# Patient Record
Sex: Female | Born: 1985 | Race: Black or African American | Hispanic: No | Marital: Single | Smoking: Current every day smoker
Health system: Southern US, Community
[De-identification: ages and names within clinical notes are randomized; demographics above are authoritative.]

## PROBLEM LIST (undated history)

## (undated) DIAGNOSIS — E669 Obesity, unspecified: Secondary | ICD-10-CM

## (undated) DIAGNOSIS — IMO0002 Reserved for concepts with insufficient information to code with codable children: Secondary | ICD-10-CM

## (undated) DIAGNOSIS — R87619 Unspecified abnormal cytological findings in specimens from cervix uteri: Secondary | ICD-10-CM

## (undated) DIAGNOSIS — I10 Essential (primary) hypertension: Secondary | ICD-10-CM

## (undated) HISTORY — DX: Reserved for concepts with insufficient information to code with codable children: IMO0002

## (undated) HISTORY — DX: Unspecified abnormal cytological findings in specimens from cervix uteri: R87.619

## (undated) HISTORY — DX: Essential (primary) hypertension: I10

---
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
## 2006-10-09 ENCOUNTER — Emergency Department (HOSPITAL_COMMUNITY): Admission: EM | Admit: 2006-10-09 | Discharge: 2006-10-09 | Payer: Self-pay | Admitting: Family Medicine

## 2007-04-07 ENCOUNTER — Ambulatory Visit: Payer: Self-pay | Admitting: Internal Medicine

## 2007-04-07 ENCOUNTER — Encounter (INDEPENDENT_AMBULATORY_CARE_PROVIDER_SITE_OTHER): Payer: Self-pay | Admitting: Nurse Practitioner

## 2007-04-07 LAB — CONVERTED CEMR LAB
ALT: 10 units/L (ref 0–35)
AST: 13 units/L (ref 0–37)
Alkaline Phosphatase: 62 units/L (ref 39–117)
BUN: 12 mg/dL (ref 6–23)
Basophils Absolute: 0 10*3/uL (ref 0.0–0.1)
Basophils Relative: 1 % (ref 0–1)
CO2: 24 meq/L (ref 19–32)
Calcium: 8.8 mg/dL (ref 8.4–10.5)
Chlamydia, Swab/Urine, PCR: POSITIVE — AB
Chloride: 105 meq/L (ref 96–112)
Creatinine, Ser: 0.61 mg/dL (ref 0.40–1.20)
Eosinophils Relative: 1 % (ref 0–5)
GC Probe Amp, Urine: NEGATIVE
Glucose, Bld: 69 mg/dL — ABNORMAL LOW (ref 70–99)
HCT: 34.3 % — ABNORMAL LOW (ref 36.0–46.0)
Hemoglobin: 10.6 g/dL — ABNORMAL LOW (ref 12.0–15.0)
Lymphocytes Relative: 30 % (ref 12–46)
Lymphs Abs: 1.9 10*3/uL (ref 0.7–3.3)
MCV: 68.6 fL — ABNORMAL LOW (ref 78.0–100.0)
Monocytes Absolute: 0.4 10*3/uL (ref 0.2–0.7)
Monocytes Relative: 7 % (ref 3–11)
Neutro Abs: 4 10*3/uL (ref 1.7–7.7)
Neutrophils Relative %: 62 % (ref 43–77)
Potassium: 3.8 meq/L (ref 3.5–5.3)
RBC: 5 M/uL (ref 3.87–5.11)
RDW: 16.8 % — ABNORMAL HIGH (ref 11.5–14.0)
TSH: 1.488 microintl units/mL (ref 0.350–5.50)
Total Bilirubin: 0.5 mg/dL (ref 0.3–1.2)

## 2007-07-02 ENCOUNTER — Emergency Department (HOSPITAL_COMMUNITY): Admission: EM | Admit: 2007-07-02 | Discharge: 2007-07-02 | Payer: Self-pay | Admitting: Emergency Medicine

## 2008-07-30 ENCOUNTER — Emergency Department (HOSPITAL_COMMUNITY): Admission: EM | Admit: 2008-07-30 | Discharge: 2008-07-30 | Payer: Self-pay | Admitting: Emergency Medicine

## 2008-10-16 ENCOUNTER — Emergency Department (HOSPITAL_COMMUNITY): Admission: EM | Admit: 2008-10-16 | Discharge: 2008-10-16 | Payer: Self-pay | Admitting: Emergency Medicine

## 2009-01-27 ENCOUNTER — Emergency Department (HOSPITAL_COMMUNITY): Admission: EM | Admit: 2009-01-27 | Discharge: 2009-01-27 | Payer: Self-pay | Admitting: *Deleted

## 2009-06-20 ENCOUNTER — Emergency Department (HOSPITAL_COMMUNITY): Admission: EM | Admit: 2009-06-20 | Discharge: 2009-06-20 | Payer: Self-pay | Admitting: Family Medicine

## 2009-11-13 ENCOUNTER — Emergency Department (HOSPITAL_COMMUNITY): Admission: EM | Admit: 2009-11-13 | Discharge: 2009-11-13 | Payer: Self-pay | Admitting: Family Medicine

## 2010-01-06 ENCOUNTER — Emergency Department (HOSPITAL_COMMUNITY): Admission: EM | Admit: 2010-01-06 | Discharge: 2010-01-06 | Payer: Self-pay | Admitting: Family Medicine

## 2010-04-15 ENCOUNTER — Emergency Department (HOSPITAL_COMMUNITY): Admission: EM | Admit: 2010-04-15 | Discharge: 2010-04-15 | Payer: Self-pay | Admitting: Emergency Medicine

## 2010-05-04 IMAGING — CR DG CHEST 2V
2 series · 2 of 2 positions shown · non-contrast
Comparison: 10/16/2008

CLINICAL DATA: Mid and upper left chest pain.

CHEST - 2 VIEW

[w chest pa]
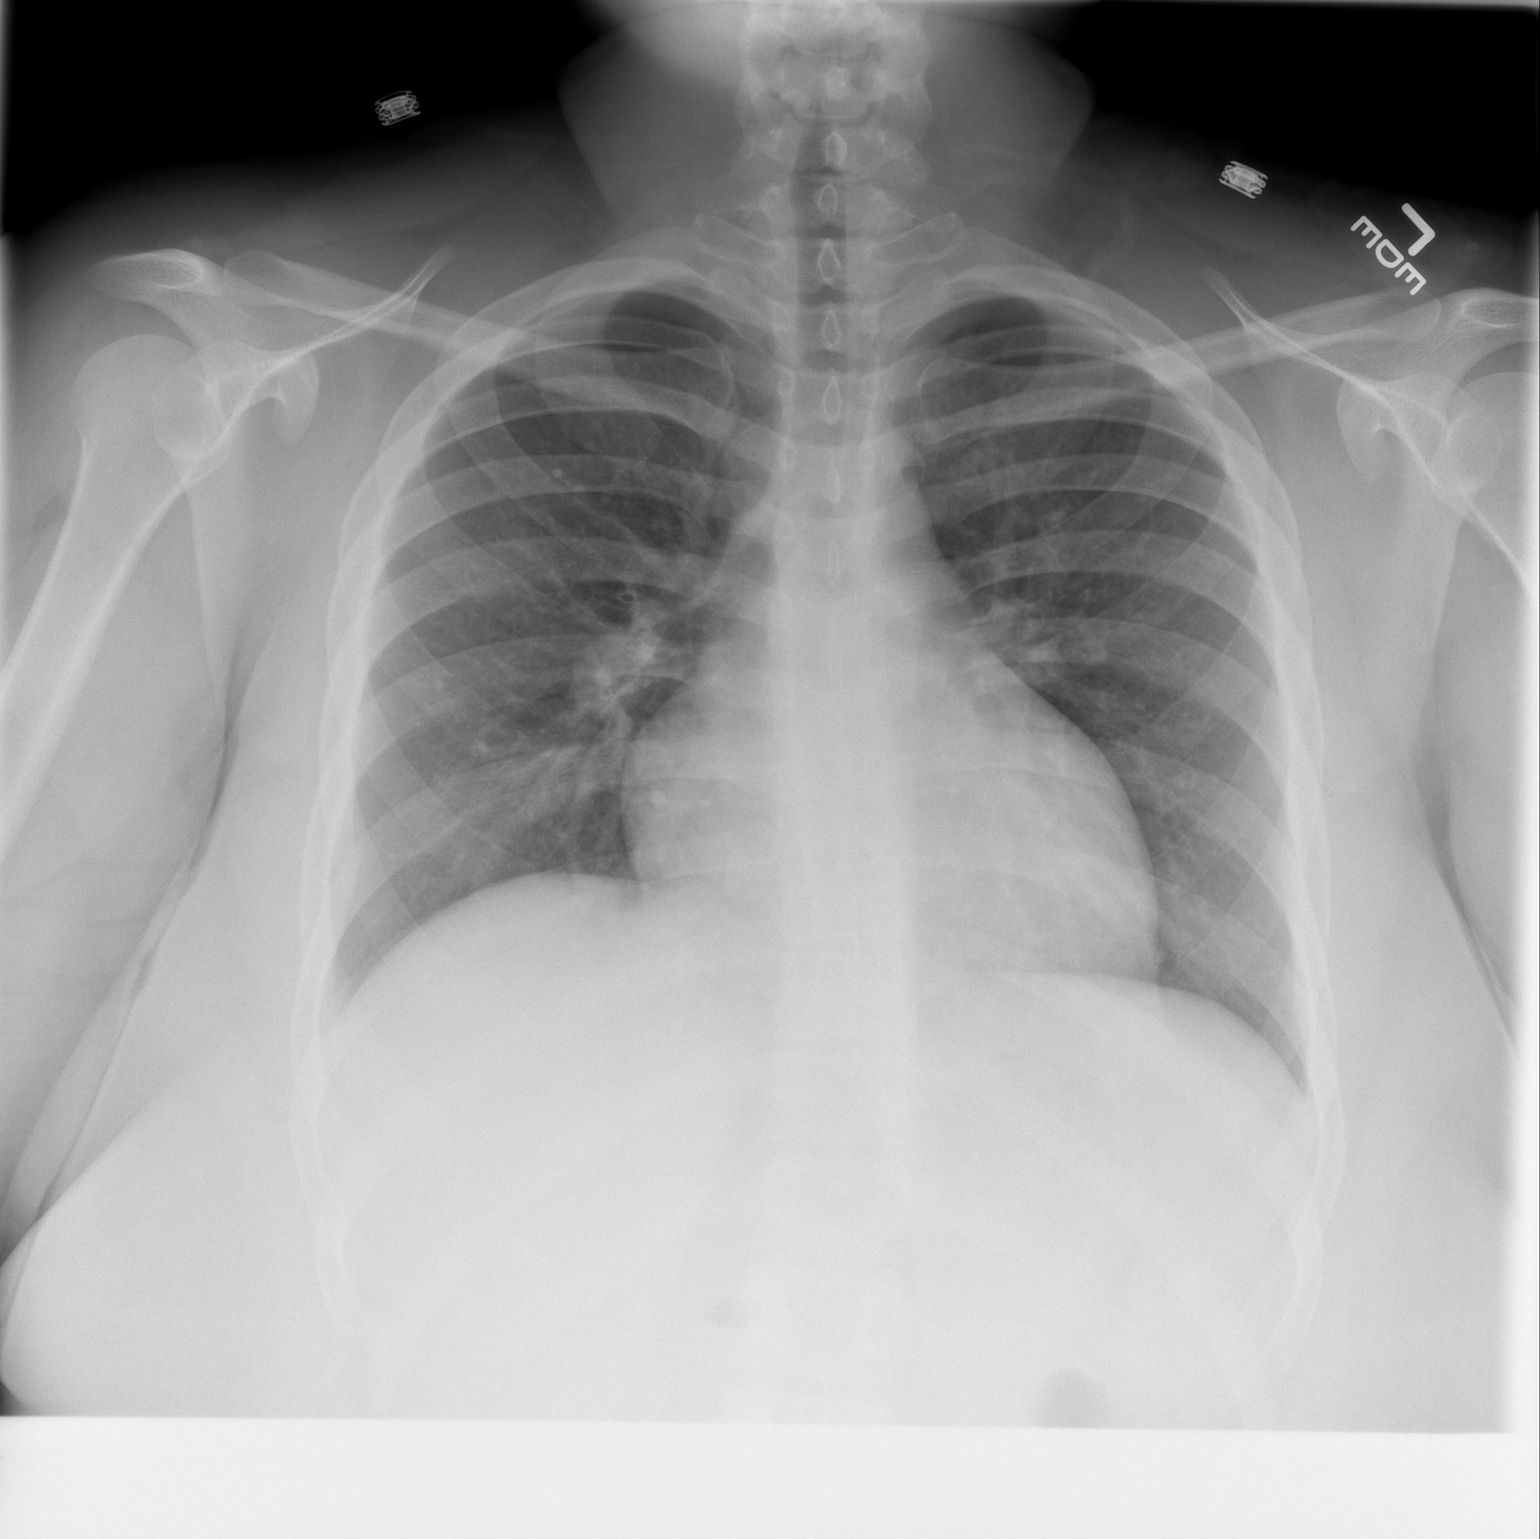

[w chest lat]
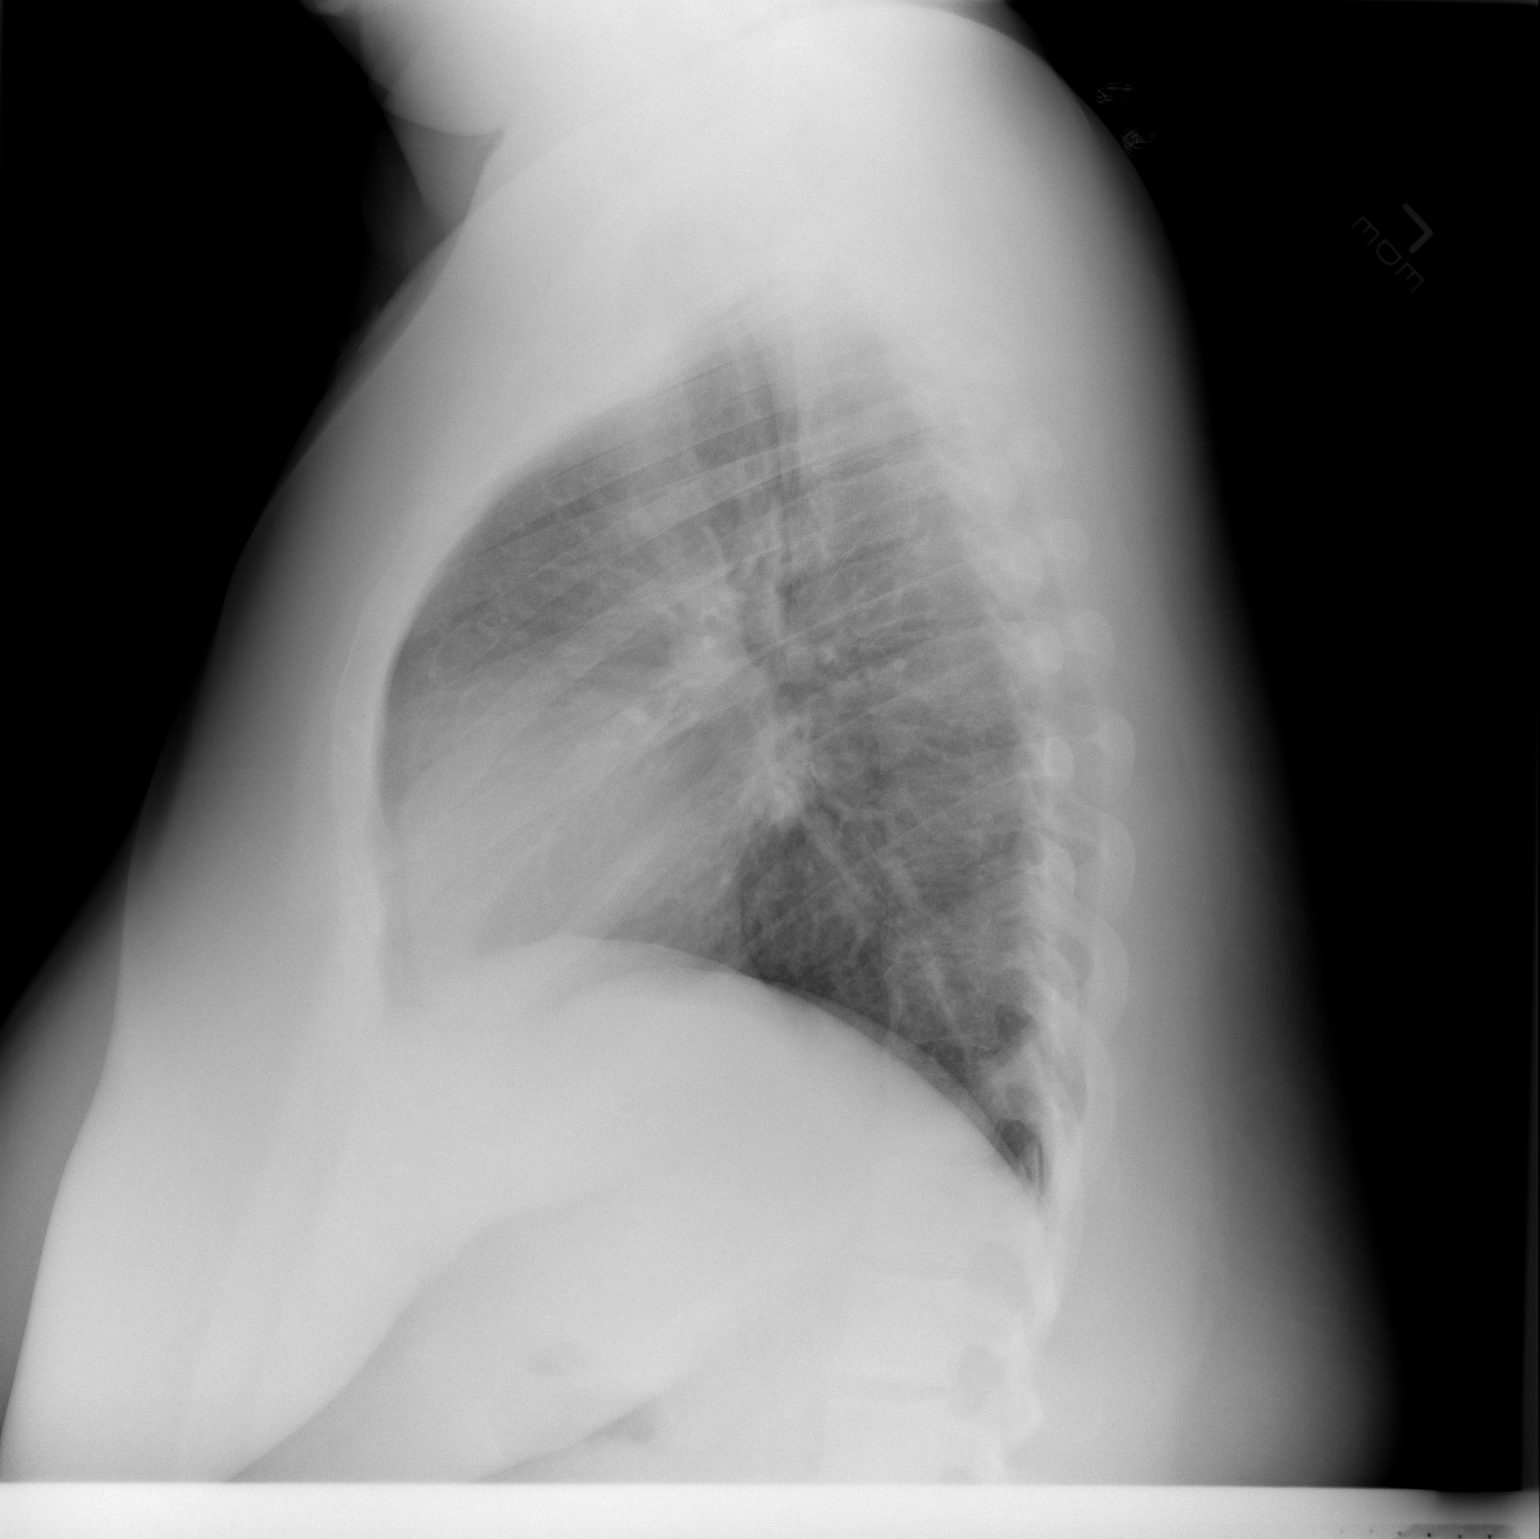

[2 of 2 positions shown; findings below may reference images not displayed]

FINDINGS: There is a lesser degree of inspiration.  Allowing for
this, the heart size and mediastinal contours are stable.  The
lungs are clear.  There is no pleural effusion or pneumothorax.  No
acute osseous findings are seen.
IMPRESSION: Stable examination allowing for the lesser degree of inspiration on
the frontal examination.  No acute chest findings.

## 2010-10-30 LAB — POCT URINALYSIS DIPSTICK
Bilirubin Urine: NEGATIVE
Glucose, UA: NEGATIVE mg/dL
Ketones, ur: NEGATIVE mg/dL
Nitrite: NEGATIVE
Protein, ur: 100 mg/dL — AB
Specific Gravity, Urine: 1.025 (ref 1.005–1.030)
Urobilinogen, UA: 1 mg/dL (ref 0.0–1.0)
pH: 6 (ref 5.0–8.0)

## 2010-10-30 LAB — POCT PREGNANCY, URINE: Preg Test, Ur: NEGATIVE

## 2010-11-08 LAB — POCT RAPID STREP A (OFFICE): Streptococcus, Group A Screen (Direct): NEGATIVE

## 2010-11-23 LAB — POCT PREGNANCY, URINE: Preg Test, Ur: NEGATIVE

## 2011-05-21 LAB — GC/CHLAMYDIA PROBE AMP, GENITAL
Chlamydia, DNA Probe: NEGATIVE
GC Probe Amp, Genital: NEGATIVE

## 2011-05-21 LAB — URINE CULTURE: Colony Count: 35000

## 2011-05-21 LAB — HERPES SIMPLEX VIRUS CULTURE: Culture: NOT DETECTED

## 2011-05-21 LAB — POCT URINALYSIS DIP (DEVICE)
Nitrite: POSITIVE — AB
Protein, ur: >=300 mg/dL — AB
Urobilinogen, UA: 2 mg/dL — ABNORMAL HIGH (ref 0.0–1.0)
pH: 6 (ref 5.0–8.0)

## 2011-05-21 LAB — WET PREP, GENITAL
Clue Cells Wet Prep HPF POC: NONE SEEN
Trich, Wet Prep: NONE SEEN
Yeast Wet Prep HPF POC: NONE SEEN

## 2011-05-21 LAB — POCT PREGNANCY, URINE: Preg Test, Ur: NEGATIVE

## 2011-05-30 ENCOUNTER — Inpatient Hospital Stay (INDEPENDENT_AMBULATORY_CARE_PROVIDER_SITE_OTHER)
Admission: RE | Admit: 2011-05-30 | Discharge: 2011-05-30 | Disposition: A | Discharge status: Home / Self Care | Payer: Self-pay | Source: Ambulatory Visit | Attending: Emergency Medicine | Admitting: Emergency Medicine

## 2011-05-30 DIAGNOSIS — H00019 Hordeolum externum unspecified eye, unspecified eyelid: Secondary | ICD-10-CM

## 2011-06-13 ENCOUNTER — Inpatient Hospital Stay (INDEPENDENT_AMBULATORY_CARE_PROVIDER_SITE_OTHER)
Admission: RE | Admit: 2011-06-13 | Discharge: 2011-06-13 | Disposition: A | Discharge status: Home / Self Care | Payer: Self-pay | Source: Ambulatory Visit | Attending: Family Medicine | Admitting: Family Medicine

## 2011-06-13 DIAGNOSIS — N898 Other specified noninflammatory disorders of vagina: Secondary | ICD-10-CM

## 2011-06-13 LAB — POCT PREGNANCY, URINE: Preg Test, Ur: NEGATIVE

## 2011-06-14 LAB — GC/CHLAMYDIA PROBE AMP, GENITAL: Chlamydia, DNA Probe: NEGATIVE

## 2011-08-17 ENCOUNTER — Emergency Department (HOSPITAL_COMMUNITY)
Admission: EM | Admit: 2011-08-17 | Discharge: 2011-08-17 | Disposition: A | Discharge status: Home / Self Care | Payer: Self-pay | Attending: Emergency Medicine | Admitting: Emergency Medicine

## 2011-08-17 ENCOUNTER — Encounter: Payer: Self-pay | Admitting: *Deleted

## 2011-08-17 DIAGNOSIS — K1370 Unspecified lesions of oral mucosa: Secondary | ICD-10-CM | POA: Insufficient documentation

## 2011-08-17 DIAGNOSIS — K029 Dental caries, unspecified: Secondary | ICD-10-CM | POA: Insufficient documentation

## 2011-08-17 DIAGNOSIS — R6884 Jaw pain: Secondary | ICD-10-CM | POA: Insufficient documentation

## 2011-08-17 DIAGNOSIS — K089 Disorder of teeth and supporting structures, unspecified: Secondary | ICD-10-CM | POA: Insufficient documentation

## 2011-08-17 MED ORDER — TRAMADOL HCL 50 MG PO TABS: 50.0000 mg | ORAL_TABLET | Freq: Four times a day (QID) | ORAL | Status: AC | PRN

## 2011-08-17 MED ORDER — PENICILLIN V POTASSIUM 250 MG PO TABS: 250.0000 mg | ORAL_TABLET | Freq: Four times a day (QID) | ORAL | Status: AC

## 2011-08-17 MED ORDER — OXYCODONE-ACETAMINOPHEN 5-325 MG PO TABS
1.0000 | ORAL_TABLET | Freq: Once | ORAL | Status: AC
  Administered 2011-08-17: 1 via ORAL
  Filled 2011-08-17: qty 1

## 2011-08-17 NOTE — ED Notes (Signed)
Pt. Started with left rear tooth ache.  Pt. Reports trying "BC Powder, Ora gel, Lemon Extract" to fix the pain.  PT. reports having toothaches before but they have always gotten better. Pt. reports hurting  In the jaw line and all the bottom teeth.

## 2011-08-17 NOTE — ED Notes (Signed)
Talked with S. Artis Flock PA about pt stating that she is ready for discharge. Awaiting discharge orders now.

## 2011-08-17 NOTE — ED Provider Notes (Signed)
Medical screening examination/treatment/procedure(s) were performed by non-physician practitioner and as supervising physician I was immediately available for consultation/collaboration.   Vanilla Heatherington, MD 08/17/11 2254 

## 2011-08-17 NOTE — ED Provider Notes (Signed)
History     CSN: [id number]  Arrival date & time 08/17/11  1605   First MD Initiated Contact with Patient 08/17/11 1713      Chief Complaint  Patient presents with  . Dental Pain    (Consider location/radiation/quality/duration/timing/severity/associated sxs/prior treatment) Patient is a 26 y.o. female presenting with tooth pain. The history is provided by the patient.  Dental PainThe primary symptoms include mouth pain. Primary symptoms do not include dental injury, oral bleeding, oral lesions, headaches, fever, sore throat or cough. The symptoms began 6 to 12 hours ago. The symptoms are unchanged. The symptoms are recurrent. The symptoms occur constantly.  Affected locations include: teeth.  Additional symptoms include: dental sensitivity to temperature and jaw pain. Additional symptoms do not include: gum swelling, gum tenderness, purulent gums, trismus, facial swelling, trouble swallowing, drooling, ear pain and hearing loss. Medical issues include: smoking.    History reviewed. No pertinent past medical history.  History reviewed. No pertinent past surgical history.  History reviewed. No pertinent family history.  History  Substance Use Topics  . Smoking status: Not on file  . Smokeless tobacco: Not on file  . Alcohol Use: No     Review of Systems  Constitutional: Negative for fever and chills.  HENT: Positive for dental problem. Negative for hearing loss, ear pain, sore throat, facial swelling, drooling, trouble swallowing, neck pain and neck stiffness.   Respiratory: Negative for cough.   Neurological: Negative for syncope and headaches.    Allergies  Benadryl  Home Medications  No current outpatient prescriptions on file.  BP 146/97  Pulse 78  Temp(Src) 98.3 F (36.8 C) (Oral)  Resp 19  SpO2 100%  LMP 07/17/2011  Physical Exam  Nursing note and vitals reviewed. Constitutional: She is oriented to person, place, and time. She appears well-developed and  well-nourished.       Uncomfortable appearing   HENT:  Head: Normocephalic and atraumatic.  Right Ear: External ear normal.  Left Ear: External ear normal.  Nose: Nose normal.  Mouth/Throat: Oropharynx is clear and moist. No oropharyngeal exudate.    Eyes: Conjunctivae are normal. Pupils are equal, round, and reactive to light.  Neck: Normal range of motion. Neck supple.       No ludwig angina  Cardiovascular: Normal rate and regular rhythm.   Pulmonary/Chest: Effort normal. No respiratory distress.  Abdominal: Soft. She exhibits no distension. There is no tenderness.  Lymphadenopathy:    She has no cervical adenopathy.  Neurological: She is alert and oriented to person, place, and time.    ED Course  Procedures (including critical care time)  Labs Reviewed - No data to display No results found.      MDM  Dental caries.        [address] [place], [place] 08/17/11 1801

## 2011-11-19 ENCOUNTER — Ambulatory Visit (INDEPENDENT_AMBULATORY_CARE_PROVIDER_SITE_OTHER): Payer: Self-pay | Admitting: *Deleted

## 2011-11-19 ENCOUNTER — Other Ambulatory Visit: Payer: Self-pay | Admitting: Obstetrics and Gynecology

## 2011-11-19 VITALS — BP 147/97 | HR 79 | Temp 99.4°F | Ht 63.0 in | Wt 283.0 lb

## 2011-11-19 DIAGNOSIS — N939 Abnormal uterine and vaginal bleeding, unspecified: Secondary | ICD-10-CM

## 2011-11-19 DIAGNOSIS — N926 Irregular menstruation, unspecified: Secondary | ICD-10-CM

## 2011-11-19 DIAGNOSIS — N938 Other specified abnormal uterine and vaginal bleeding: Secondary | ICD-10-CM

## 2011-11-19 DIAGNOSIS — N63 Unspecified lump in unspecified breast: Secondary | ICD-10-CM

## 2011-11-19 DIAGNOSIS — Z1239 Encounter for other screening for malignant neoplasm of breast: Secondary | ICD-10-CM

## 2011-11-19 MED ORDER — MEDROXYPROGESTERONE ACETATE 10 MG PO TABS: 20.0000 mg | ORAL_TABLET | Freq: Every day | ORAL | Status: DC

## 2011-11-19 NOTE — Patient Instructions (Signed)

## 2011-11-19 NOTE — Progress Notes (Signed)
Complaints of left breast lump and AUB.   Pap Smear:    Pap smear not performed today. Patients last Pap smear was 11/03/11 at the [facility] and was normal. Per patient she has a history of abnormal Pap smear that required a repeat Pap smear. No Pap smear results in EPIC.  Physical exam: Breasts Left breast slightly larger than right breast.. No skin abnormalities bilateral breasts. No nipple retraction bilateral breasts. No nipple discharge bilateral breasts. No lymphadenopathy. No lumps palpated right breast. Palpated lump in the left breast around  around 12 o'clock 7 cm from the areola. No complaints of pain or tenderness on exam. Patient referred to the [facility] for left breast ultrasound. Appointment scheduled for Monday, November 22, 2011 at 1300.         Pelvic/Bimanual No Pap smear completed today since last Pap smear was 11/03/11 and normal. Pap smear not indicated per BCCCP guidelines. Patient complained of AUB. Patient stated it started in October 2012 when she bled for 3 weeks. Since then patient stated she has been bleeding between menstrual periods. Patient then stated she has been bleeding non-stop for the past 2 months. She was started on birth control pills Brevicon on 11/03/11 and stated the bleeding has gotten heavier since started. Talked with Dr. Macon Large and orders written. Patient is scheduled to follow up at the [facility] for follow up on Dec 22, 2011.  Taught patient how to perform BSE and gave educational materials to take home. Patient did not need a Pap smear today due to last Pap smear was 11/03/11. Patient is scheduled for a left breast ultrasound at the [facility] next  Monday, November 22, 2011 at 1300. Patient is scheduled to follow up at the [facility] Dec 22, 2011 at 1415. Talked with patient about her AUB and let her know that Dr. Macon Large ordered for her to take Provera 20 mg daily until her appointment. Talked with  her about endometrial biopsies and let her know they will more than likely do one at that appointment. Told patient to stop taking birth control pills and to start the Provera per orders. Gave patient condoms to use for birth control. Patient also had elevated BP at visit of 147/97. Talked with her about her BP. Recommended that she have her BP checked several times and if continues to be elevated to contact her PCP. Gave her a form to fill out with results to share with her doctor. Patient aware of appointments and will be there. Let patient know will follow up with her within the next couple weeks with results. Patient verbalized understanding.

## 2011-11-22 ENCOUNTER — Inpatient Hospital Stay: Admission: RE | Admit: 2011-11-22 | Payer: Self-pay | Source: Ambulatory Visit

## 2011-11-30 ENCOUNTER — Telehealth: Payer: Self-pay

## 2011-11-30 NOTE — Telephone Encounter (Signed)
Called pt and was unable to leave message due to someone picking up and hanging up x 2.  Called cell # [phone] and was unable to leave a message due "cricket customer is unavailable".

## 2011-12-07 ENCOUNTER — Telehealth: Payer: Self-pay

## 2011-12-07 NOTE — Telephone Encounter (Signed)
Called pt and left message stated that this is our second attempt to please give [place] a return call back to Fonnie Mu @ [phone].   Will notify Wynona Canes to send out certified letter.

## 2011-12-22 ENCOUNTER — Encounter: Payer: Self-pay | Admitting: Obstetrics and Gynecology

## 2011-12-22 ENCOUNTER — Ambulatory Visit (INDEPENDENT_AMBULATORY_CARE_PROVIDER_SITE_OTHER): Payer: Self-pay | Admitting: Obstetrics and Gynecology

## 2011-12-22 ENCOUNTER — Encounter: Payer: Self-pay | Admitting: Advanced Practice Midwife

## 2011-12-22 VITALS — BP 130/80 | HR 89 | Temp 98.3°F | Ht 63.0 in | Wt 284.3 lb

## 2011-12-22 DIAGNOSIS — Z01812 Encounter for preprocedural laboratory examination: Secondary | ICD-10-CM

## 2011-12-22 DIAGNOSIS — N915 Oligomenorrhea, unspecified: Secondary | ICD-10-CM

## 2011-12-22 HISTORY — DX: Oligomenorrhea, unspecified: N91.5

## 2011-12-22 LAB — TSH: TSH: 0.976 u[IU]/mL (ref 0.350–4.500)

## 2011-12-22 LAB — LUTEINIZING HORMONE: LH: 2.5 m[IU]/mL

## 2011-12-22 LAB — FOLLICLE STIMULATING HORMONE: FSH: 5.7 m[IU]/mL

## 2011-12-22 MED ORDER — NORGESTIMATE-ETH ESTRADIOL 0.25-35 MG-MCG PO TABS: 1.0000 | ORAL_TABLET | Freq: Every day | ORAL | Status: DC

## 2011-12-22 NOTE — Progress Notes (Signed)
  Subjective:    Patient ID: Linda Weber, female    DOB: Feb 19, 1986, 26 y.o.   MRN: [id number]  HPI 26 yo nulligravid with LMP 09/30/2011 and BMI 50 presenting today for evaluation of abnormal vaginal bleeding. Patient reports having normal predictable monthly menses all her life with her last normal period occuring in August. Patient did not have a period from September until February when she bled for 3 weeks. Patient denies chest pain, SOB, lightheadedness/dizziness. Patient was started on birth control pill but discontinued it without finishing a pack due to persistent bleeding. Patient was provided with a Rx Provera but never filled it because her bleeding stopped. Patient is sexually active without condoms and desires a pregnancy. Patient admits to being under a lot of emotional stress over the past several months.   Review of Systems     Objective:   Physical Exam GENERAL: Well-developed, well-nourished female in no acute distress.  HEENT: Normocephalic, atraumatic. Sclerae anicteric.  NECK: Supple. Normal thyroid.  LUNGS: Clear to auscultation bilaterally.  HEART: Regular rate and rhythm. ABDOMEN: Soft, nontender, nondistended. No organomegaly. obese PELVIC: Normal external female genitalia. Vagina is pink and rugated.  Normal discharge. Normal appearing cervix. Bimanual exam limited secondary to body habitus EXTREMITIES: No cyanosis, clubbing, or edema, 2+ distal pulses.     Assessment & Plan:  26 yo with oligomenorrhea - Will check TSH, FSH and LH - Will order pelvic ultrasound - Rx Ortho cyclen provided and patient instructed to complete 3 packs before deciding that it is not controlling her bleeding. - Patient was encouraged to enroll in a weight loss program and to quit smoking

## 2011-12-22 NOTE — Patient Instructions (Signed)
Abnormal Uterine Bleeding Abnormal uterine bleeding can have many causes. Some cases are simply treated, while others are more serious. There are several kinds of bleeding that is considered abnormal, including:  Bleeding between periods.   Bleeding after sexual intercourse.   Spotting anytime in the menstrual cycle.   Bleeding heavier or more than normal.   Bleeding after menopause.  CAUSES  There are many causes of abnormal uterine bleeding. It can be present in teenagers, pregnant women, women during their reproductive years, and women who have reached menopause. Your caregiver will look for the more common causes depending on your age, signs, symptoms and your particular circumstance. Most cases are not serious and can be treated. Even the more serious causes, like cancer of the female organs, can be treated adequately if found in the early stages. That is why all types of bleeding should be evaluated and treated as soon as possible. DIAGNOSIS  Diagnosing the cause may take several kinds of tests. Your caregiver may:  Take a complete history of the type of bleeding.   Perform a complete physical exam and Pap smear.   Take an ultrasound on the abdomen showing a picture of the female organs and the pelvis.   Inject dye into the uterus and Fallopian tubes and X-ray them (hysterosalpingogram).   Place fluid in the uterus and do an ultrasound (sonohysterogrqphy).   Take a CT scan to examine the female organs and pelvis.   Take an MRI to examine the female organs and pelvis. There is no X-ray involved with this procedure.   Look inside the uterus with a telescope that has a light at the end (hysteroscopy).   Scrap the inside of the uterus to get tissue to examine (Dilatation and Curettage, D&C).   Look into the pelvis with a telescope that has a light at the end (laparoscopy). This is done through a very small cut (incision) in the abdomen.  TREATMENT  Treatment will depend on the  cause of the abnormal bleeding. It can include:  Doing nothing to allow the problem to take care of itself over time.   Hormone treatment.   Birth control pills.   Treating the medical condition causing the problem.   Laparoscopy.   Major or minor surgery   Destroying the lining of the uterus with electrical currant, laser, freezing or heat (uterine ablation).  HOME CARE INSTRUCTIONS   Follow your caregiver's recommendation on how to treat your problem.   See your caregiver if you missed a menstrual period and think you may be pregnant.   If you are bleeding heavily, count the number of pads/tampons you use and how often you have to change them. Tell this to your caregiver.   Avoid sexual intercourse until the problem is controlled.  SEEK MEDICAL CARE IF:   You have any kind of abnormal bleeding mentioned above.   You feel dizzy at times.   You are 26 years old and have not had a menstrual period yet.  SEEK IMMEDIATE MEDICAL CARE IF:   You pass out.   You are changing pads/tampons every 15 to 30 minutes.   You have belly (abdominal) pain.   You have a temperature of 100.4 F (38.4 C) or higher.   You become sweaty or weak.   You are passing large blood clots from the vagina.   You start to feel sick to your stomach (nauseous) and throw up (vomit).  Document Released: 08/02/2005 Document Revised: 07/22/2011 Document Reviewed: 12/26/2008 Exit[employer]  Patient Information 2012 [employer]. 

## 2011-12-24 ENCOUNTER — Ambulatory Visit (HOSPITAL_COMMUNITY)
Admission: RE | Admit: 2011-12-24 | Discharge: 2011-12-24 | Disposition: A | Discharge status: Home / Self Care | Payer: Self-pay | Source: Ambulatory Visit | Attending: Obstetrics and Gynecology | Admitting: Obstetrics and Gynecology

## 2011-12-24 DIAGNOSIS — N915 Oligomenorrhea, unspecified: Secondary | ICD-10-CM

## 2011-12-24 DIAGNOSIS — N938 Other specified abnormal uterine and vaginal bleeding: Secondary | ICD-10-CM | POA: Insufficient documentation

## 2011-12-24 DIAGNOSIS — N949 Unspecified condition associated with female genital organs and menstrual cycle: Secondary | ICD-10-CM | POA: Insufficient documentation

## 2011-12-24 DIAGNOSIS — N838 Other noninflammatory disorders of ovary, fallopian tube and broad ligament: Secondary | ICD-10-CM | POA: Insufficient documentation

## 2011-12-30 ENCOUNTER — Telehealth: Payer: Self-pay | Admitting: *Deleted

## 2011-12-30 NOTE — Telephone Encounter (Signed)
Patient called in regards to BCCCP results. Let her know she needs to reschedule her breast ultrasound from April 8th since she did not show for appointment. Patient given phone number to the Breast Center to call and schedule. Told patient BCCCP will cover follow up. Patient verbalized understanding.

## 2011-12-30 NOTE — Telephone Encounter (Signed)
Patient would like results of ultra sound. Can we give over the phone or we she need to make an appointment.

## 2012-01-03 ENCOUNTER — Ambulatory Visit
Admission: RE | Admit: 2012-01-03 | Discharge: 2012-01-03 | Disposition: A | Discharge status: Home / Self Care | Payer: No Typology Code available for payment source | Source: Ambulatory Visit | Attending: Obstetrics and Gynecology | Admitting: Obstetrics and Gynecology

## 2012-01-03 DIAGNOSIS — N63 Unspecified lump in unspecified breast: Secondary | ICD-10-CM

## 2012-02-15 ENCOUNTER — Telehealth: Payer: Self-pay | Admitting: *Deleted

## 2012-02-15 DIAGNOSIS — N83209 Unspecified ovarian cyst, unspecified side: Secondary | ICD-10-CM

## 2012-02-15 NOTE — Telephone Encounter (Signed)
Pt left message requesting results from [place] on 12/24/11.  Call request routed to Dr. Jolayne Panther for advice.

## 2012-02-16 ENCOUNTER — Telehealth: Payer: Self-pay | Admitting: *Deleted

## 2012-02-16 NOTE — Telephone Encounter (Signed)
Called Linda Weber and notified her of ultrasound results and needing follow up ultrasound scheduled- patient does not have any preference on day or time . Called and scheduled ultrasound for 03/16/12 at 9;45 and  Called patient and notified. Patient also want to send a message to doctor that she did a symptom checker and she thinks she has PCOS. Offered to have front office to call her and schedule an appt after ultrasound so she can get results and discuss with doctor why she thinks she has PCOS- patient would like appointment

## 2012-02-16 NOTE — Telephone Encounter (Signed)
Called pt and heard message stating that the customer is unavailable at this time.  Pt may be informed of her results per Dr. Jolayne Panther. **Note: it is the Lt ovary which has the cyst- not the right as in Dr. Deretha Emory note.  Also, pt may be scheduled for trans vag [place] during the first week of August- order has been placed.

## 2012-02-16 NOTE — Telephone Encounter (Signed)
Message copied by Jill Side on Wed Feb 16, 2012  8:36 AM ------      Message from: Linda Weber      Created: Tue Feb 15, 2012 12:39 PM      Regarding: Ultrasound result       Please inform patient that 5/10 ultrasound was normal with the exception of a 4 cm cyst on her right ovary, for which a follow-up ultrasound will be scheduled in August.            [name]

## 2012-03-16 ENCOUNTER — Ambulatory Visit (HOSPITAL_COMMUNITY)
Admission: RE | Admit: 2012-03-16 | Discharge: 2012-03-16 | Disposition: A | Discharge status: Home / Self Care | Payer: Self-pay | Source: Ambulatory Visit | Attending: Obstetrics and Gynecology | Admitting: Obstetrics and Gynecology

## 2012-03-16 DIAGNOSIS — N926 Irregular menstruation, unspecified: Secondary | ICD-10-CM | POA: Insufficient documentation

## 2012-03-16 DIAGNOSIS — N83209 Unspecified ovarian cyst, unspecified side: Secondary | ICD-10-CM | POA: Insufficient documentation

## 2012-03-20 ENCOUNTER — Ambulatory Visit: Payer: Self-pay | Admitting: Obstetrics and Gynecology

## 2012-04-13 ENCOUNTER — Ambulatory Visit: Payer: Self-pay | Admitting: Obstetrics & Gynecology

## 2012-07-23 ENCOUNTER — Encounter (HOSPITAL_COMMUNITY): Payer: Self-pay | Admitting: *Deleted

## 2012-07-23 ENCOUNTER — Emergency Department (HOSPITAL_COMMUNITY)
Admission: EM | Admit: 2012-07-23 | Discharge: 2012-07-23 | Disposition: A | Discharge status: Home / Self Care | Payer: Self-pay | Attending: Emergency Medicine | Admitting: Emergency Medicine

## 2012-07-23 DIAGNOSIS — F17200 Nicotine dependence, unspecified, uncomplicated: Secondary | ICD-10-CM | POA: Insufficient documentation

## 2012-07-23 DIAGNOSIS — K0889 Other specified disorders of teeth and supporting structures: Secondary | ICD-10-CM

## 2012-07-23 DIAGNOSIS — K089 Disorder of teeth and supporting structures, unspecified: Secondary | ICD-10-CM | POA: Insufficient documentation

## 2012-07-23 MED ORDER — HYDROCODONE-ACETAMINOPHEN 5-325 MG PO TABS: 1.0000 | ORAL_TABLET | Freq: Four times a day (QID) | ORAL | Status: DC | PRN

## 2012-07-23 MED ORDER — HYDROCODONE-ACETAMINOPHEN 5-325 MG PO TABS
1.0000 | ORAL_TABLET | Freq: Once | ORAL | Status: AC
  Administered 2012-07-23: 1 via ORAL
  Filled 2012-07-23: qty 1

## 2012-07-23 MED ORDER — NAPROXEN 500 MG PO TABS: 500.0000 mg | ORAL_TABLET | Freq: Two times a day (BID) | ORAL | Status: DC

## 2012-07-23 MED ORDER — PENICILLIN V POTASSIUM 500 MG PO TABS: 500.0000 mg | ORAL_TABLET | Freq: Four times a day (QID) | ORAL | Status: DC

## 2012-07-23 NOTE — ED Notes (Signed)
Stated teeth to her left side of mouth, stated pain being going for one week. Stated taking OTC  Without any relief

## 2012-07-23 NOTE — ED Notes (Signed)
The pt has  Mouth and tooth pain for one week

## 2012-07-23 NOTE — ED Provider Notes (Signed)
History     CSN: [id number]  Arrival date & time 07/23/12  0240   First MD Initiated Contact with Patient 07/23/12 0252      Chief Complaint  Patient presents with  . Dental Pain    (Consider location/radiation/quality/duration/timing/severity/associated sxs/prior treatment) Patient is a 26 y.o. female presenting with tooth pain. The history is provided by the patient.  Dental PainPrimary symptoms do not include headaches, fever, shortness of breath or sore throat.   patient was in discomfort to her left lower molar for 3 weeks but worse in the last week. At tooth cracked off as I stated 3 weeks ago now has some gum swelling and increased tenderness. No fevers. No discharge. Pain is stated to be 10 out of 10 nonradiating described as sharp.  Past Medical History  Diagnosis Date  . Abnormal Pap smear     History reviewed. No pertinent past surgical history.  Family History  Problem Relation Age of Onset  . Diabetes Mother   . Hypertension Mother   . Breast cancer Maternal Grandmother     History  Substance Use Topics  . Smoking status: Current Every Day Smoker -- 1.0 packs/day for 10 years  . Smokeless tobacco: Never Used  . Alcohol Use: No    OB History    Grav Para Term Preterm Abortions TAB SAB Ect Mult Living   0         0      Review of Systems  Constitutional: Negative for fever.  HENT: Negative for congestion, sore throat and neck pain.   Eyes: Negative for redness.  Respiratory: Negative for shortness of breath.   Gastrointestinal: Negative for nausea and vomiting.  Genitourinary: Negative for dysuria.  Musculoskeletal: Negative for back pain.  Skin: Negative for rash.  Neurological: Negative for headaches.    Allergies  Benadryl  Home Medications   Current Outpatient Rx  Name  Route  Sig  Dispense  Refill  . BENZOCAINE 10 % MT GEL   Mouth/Throat   Use as directed 1 application in the mouth or throat 3 (three) times daily as needed. For dental  pain         . HYDROCODONE-ACETAMINOPHEN 5-325 MG PO TABS   Oral   Take 1-2 tablets by mouth every 6 (six) hours as needed for pain.   14 tablet   0   . NAPROXEN 500 MG PO TABS   Oral   Take 1 tablet (500 mg total) by mouth 2 (two) times daily.   14 tablet   0   . PENICILLIN V POTASSIUM 500 MG PO TABS   Oral   Take 1 tablet (500 mg total) by mouth 4 (four) times daily.   40 tablet   0     BP 148/76  Pulse 74  Temp 98.8 F (37.1 C) (Oral)  Resp 20  SpO2 98%  Physical Exam  Nursing note and vitals reviewed. Constitutional: She is oriented to person, place, and time. She appears well-developed and well-nourished. No distress.  HENT:  Head: Normocephalic and atraumatic.  Mouth/Throat: Oropharynx is clear and moist. No oropharyngeal exudate.       Tenderness of the first left lower molar with some gum swelling. No discharge. No bleeding.  Eyes: Conjunctivae normal and EOM are normal. Pupils are equal, round, and reactive to light.  Neck: Normal range of motion. Neck supple.  Cardiovascular: Normal rate, regular rhythm and normal heart sounds.   No murmur heard. Pulmonary/Chest: Effort normal and  breath sounds normal.  Abdominal: Soft. Bowel sounds are normal. There is no tenderness.  Musculoskeletal: Normal range of motion.  Lymphadenopathy:    She has no cervical adenopathy.  Neurological: She is alert and oriented to person, place, and time. No cranial nerve deficit. She exhibits normal muscle tone. Coordination normal.  Skin: Skin is warm. No rash noted.    ED Course  Procedures (including critical care time)  Labs Reviewed - No data to display No results found.   1. Toothache       MDM   Patient's findings are consistent with a left lower molar apical tooth abscess some gum swelling tenderness to palpation of the tooth. No significant adenopathy no trouble swallowing.       Shelda Jakes, MD 07/23/12 [phone]

## 2012-09-18 ENCOUNTER — Encounter (HOSPITAL_COMMUNITY): Payer: Self-pay | Admitting: Emergency Medicine

## 2012-09-18 ENCOUNTER — Emergency Department (HOSPITAL_COMMUNITY)
Admission: EM | Admit: 2012-09-18 | Discharge: 2012-09-19 | Disposition: A | Discharge status: Home / Self Care | Payer: Self-pay | Attending: Emergency Medicine | Admitting: Emergency Medicine

## 2012-09-18 DIAGNOSIS — F17200 Nicotine dependence, unspecified, uncomplicated: Secondary | ICD-10-CM | POA: Insufficient documentation

## 2012-09-18 DIAGNOSIS — K089 Disorder of teeth and supporting structures, unspecified: Secondary | ICD-10-CM | POA: Insufficient documentation

## 2012-09-18 DIAGNOSIS — K0889 Other specified disorders of teeth and supporting structures: Secondary | ICD-10-CM

## 2012-09-18 NOTE — ED Notes (Signed)
Patient complaining of left sided mouth/tooth/gum pain that started yesterday.  Reports inflammation around gumline.  Last dental visit several years ago (patient unsure).

## 2012-09-19 MED ORDER — HYDROCODONE-ACETAMINOPHEN 5-325 MG PO TABS: 1.0000 | ORAL_TABLET | ORAL | Status: DC | PRN

## 2012-09-19 MED ORDER — PENICILLIN V POTASSIUM 500 MG PO TABS: 500.0000 mg | ORAL_TABLET | Freq: Three times a day (TID) | ORAL | Status: DC

## 2012-09-19 MED ORDER — IBUPROFEN 800 MG PO TABS: 800.0000 mg | ORAL_TABLET | Freq: Three times a day (TID) | ORAL | Status: DC

## 2012-09-19 MED ORDER — HYDROCODONE-ACETAMINOPHEN 5-325 MG PO TABS
1.0000 | ORAL_TABLET | Freq: Once | ORAL | Status: AC
  Administered 2012-09-19: 1 via ORAL
  Filled 2012-09-19: qty 1

## 2012-09-19 NOTE — ED Provider Notes (Signed)
Medical screening examination/treatment/procedure(s) were performed by non-physician practitioner and as supervising physician I was immediately available for consultation/collaboration.  Tannie Koskela K Candice Lunney-Rasch, MD 09/19/12 0331 

## 2012-09-19 NOTE — ED Provider Notes (Signed)
History     CSN: [id number]  Arrival date & time 09/18/12  2046   None     Chief Complaint  Patient presents with  . Dental Pain    (Consider location/radiation/quality/duration/timing/severity/associated sxs/prior treatment) HPI History provided by pt.   Pt presents w/ left lower dental pain x 3 days.  Tooth broke on a piece of food recently.  Pain severe and radiates to entire left side of jaw.  Mild relief w/ tylenol.  No associated fever or sore throat.  Per prior chart, seen for same in 07/2012.  Pt reports that her sx resolved after completing abx and she did not follow up with a dentist.  Past Medical History  Diagnosis Date  . Abnormal Pap smear     History reviewed. No pertinent past surgical history.  Family History  Problem Relation Age of Onset  . Diabetes Mother   . Hypertension Mother   . Breast cancer Maternal Grandmother     History  Substance Use Topics  . Smoking status: Current Every Day Smoker -- 1.0 packs/day for 10 years  . Smokeless tobacco: Never Used  . Alcohol Use: Yes    OB History    Grav Para Term Preterm Abortions TAB SAB Ect Mult Living   0         0      Review of Systems  All other systems reviewed and are negative.    Allergies  Benadryl  Home Medications   Current Outpatient Rx  Name  Route  Sig  Dispense  Refill  . ACETAMINOPHEN 500 MG PO TABS   Oral   Take 1,000 mg by mouth every 6 (six) hours as needed. For pain         . HYDROCODONE-ACETAMINOPHEN 5-325 MG PO TABS   Oral   Take 1 tablet by mouth every 4 (four) hours as needed for pain.   12 tablet   0   . IBUPROFEN 800 MG PO TABS   Oral   Take 1 tablet (800 mg total) by mouth 3 (three) times daily.   12 tablet   0   . PENICILLIN V POTASSIUM 500 MG PO TABS   Oral   Take 1 tablet (500 mg total) by mouth 3 (three) times daily.   30 tablet   0     BP 151/104  Pulse 83  Temp 99.8 F (37.7 C) (Oral)  Resp 18  SpO2 100%  Physical Exam  Nursing note  and vitals reviewed. Constitutional: She is oriented to person, place, and time. She appears well-developed and well-nourished.  HENT:  Head: Normocephalic and atraumatic. No trismus in the jaw.  Mouth/Throat: Uvula is midline and oropharynx is clear and moist.       Left lower first molar w/ Rennis Harding I fx on inner posterior aspect.  Severely ttp.  Adjacent gingiva appears normal.  No edema of buccal mucosa.    Eyes:       Normal appearance  Neck: Normal range of motion. Neck supple.       No submandibular edema  Lymphadenopathy:    She has no cervical adenopathy.  Neurological: She is alert and oriented to person, place, and time.  Psychiatric: She has a normal mood and affect. Her behavior is normal.    ED Course  Procedures (including critical care time)  Labs Reviewed - No data to display No results found.   1. Toothache       MDM  26yo F presents  w/ left lower molar pain.  Will treat for possible periapical abscess.  Prescribed penicillin, 12 vicodin and 800mg  ibuprofen.  Referred to dentist on call. Return precautions discussed. 12:07 AM         Otilio Miu, PA-C 09/19/12 0008

## 2013-05-30 ENCOUNTER — Encounter: Payer: Self-pay | Admitting: Obstetrics and Gynecology

## 2013-05-30 ENCOUNTER — Ambulatory Visit (INDEPENDENT_AMBULATORY_CARE_PROVIDER_SITE_OTHER): Payer: Self-pay | Admitting: Obstetrics and Gynecology

## 2013-05-30 VITALS — BP 155/103 | HR 75 | Temp 98.5°F | Ht 62.0 in | Wt 273.0 lb

## 2013-05-30 DIAGNOSIS — Z01419 Encounter for gynecological examination (general) (routine) without abnormal findings: Secondary | ICD-10-CM

## 2013-05-30 NOTE — Patient Instructions (Signed)
Preventive Care for Adults, Female A healthy lifestyle and preventive care can promote health and wellness. Preventive health guidelines for women include the following key practices.  A routine yearly physical is a good way to check with your caregiver about your health and preventive screening. It is a chance to share any concerns and updates on your health, and to receive a thorough exam.  Visit your dentist for a routine exam and preventive care every 6 months. Brush your teeth twice a day and floss once a day. Good oral hygiene prevents tooth decay and gum disease.  The frequency of eye exams is based on your age, health, family medical history, use of contact lenses, and other factors. Follow your caregiver's recommendations for frequency of eye exams.  Eat a healthy diet. Foods like vegetables, fruits, whole grains, low-fat dairy products, and lean protein foods contain the nutrients you need without too many calories. Decrease your intake of foods high in solid fats, added sugars, and salt. Eat the right amount of calories for you.Get information about a proper diet from your caregiver, if necessary.  Regular physical exercise is one of the most important things you can do for your health. Most adults should get at least 150 minutes of moderate-intensity exercise (any activity that increases your heart rate and causes you to sweat) each week. In addition, most adults need muscle-strengthening exercises on 2 or more days a week.  Maintain a healthy weight. The body mass index (BMI) is a screening tool to identify possible weight problems. It provides an estimate of body fat based on height and weight. Your caregiver can help determine your BMI, and can help you achieve or maintain a healthy weight.For adults 20 years and older:  A BMI below 18.5 is considered underweight.  A BMI of 18.5 to 24.9 is normal.  A BMI of 25 to 29.9 is considered overweight.  A BMI of 30 and above is  considered obese.  Maintain normal blood lipids and cholesterol levels by exercising and minimizing your intake of saturated fat. Eat a balanced diet with plenty of fruit and vegetables. Blood tests for lipids and cholesterol should begin at age 20 and be repeated every 5 years. If your lipid or cholesterol levels are high, you are over 50, or you are at high risk for heart disease, you may need your cholesterol levels checked more frequently.Ongoing high lipid and cholesterol levels should be treated with medicines if diet and exercise are not effective.  If you smoke, find out from your caregiver how to quit. If you do not use tobacco, do not start.  If you are pregnant, do not drink alcohol. If you are breastfeeding, be very cautious about drinking alcohol. If you are not pregnant and choose to drink alcohol, do not exceed 1 drink per day. One drink is considered to be 12 ounces (355 mL) of beer, 5 ounces (148 mL) of wine, or 1.5 ounces (44 mL) of liquor.  Avoid use of street drugs. Do not share needles with anyone. Ask for help if you need support or instructions about stopping the use of drugs.  High blood pressure causes heart disease and increases the risk of stroke. Your blood pressure should be checked at least every 1 to 2 years. Ongoing high blood pressure should be treated with medicines if weight loss and exercise are not effective.  If you are 55 to 26 years old, ask your caregiver if you should take aspirin to prevent strokes.  Diabetes   screening involves taking a blood sample to check your fasting blood sugar level. This should be done once every 3 years, after age 45, if you are within normal weight and without risk factors for diabetes. Testing should be considered at a younger age or be carried out more frequently if you are overweight and have at least 1 risk factor for diabetes.  Breast cancer screening is essential preventive care for women. You should practice "breast  self-awareness." This means understanding the normal appearance and feel of your breasts and may include breast self-examination. Any changes detected, no matter how small, should be reported to a caregiver. Women in their 20s and 30s should have a clinical breast exam (CBE) by a caregiver as part of a regular health exam every 1 to 3 years. After age 40, women should have a CBE every year. Starting at age 40, women should consider having a mammography (breast X-ray test) every year. Women who have a family history of breast cancer should talk to their caregiver about genetic screening. Women at a high risk of breast cancer should talk to their caregivers about having magnetic resonance imaging (MRI) and a mammography every year.  The Pap test is a screening test for cervical cancer. A Pap test can show cell changes on the cervix that might become cervical cancer if left untreated. A Pap test is a procedure in which cells are obtained and examined from the lower end of the uterus (cervix).  Women should have a Pap test starting at age 21.  Between ages 21 and 29, Pap tests should be repeated every 2 years.  Beginning at age 30, you should have a Pap test every 3 years as long as the past 3 Pap tests have been normal.  Some women have medical problems that increase the chance of getting cervical cancer. Talk to your caregiver about these problems. It is especially important to talk to your caregiver if a new problem develops soon after your last Pap test. In these cases, your caregiver may recommend more frequent screening and Pap tests.  The above recommendations are the same for women who have or have not gotten the vaccine for human papillomavirus (HPV).  If you had a hysterectomy for a problem that was not cancer or a condition that could lead to cancer, then you no longer need Pap tests. Even if you no longer need a Pap test, a regular exam is a good idea to make sure no other problems are  starting.  If you are between ages 65 and 70, and you have had normal Pap tests going back 10 years, you no longer need Pap tests. Even if you no longer need a Pap test, a regular exam is a good idea to make sure no other problems are starting.  If you have had past treatment for cervical cancer or a condition that could lead to cancer, you need Pap tests and screening for cancer for at least 20 years after your treatment.  If Pap tests have been discontinued, risk factors (such as a new sexual partner) need to be reassessed to determine if screening should be resumed.  The HPV test is an additional test that may be used for cervical cancer screening. The HPV test looks for the virus that can cause the cell changes on the cervix. The cells collected during the Pap test can be tested for HPV. The HPV test could be used to screen women aged 30 years and older, and should   be used in women of any age who have unclear Pap test results. After the age of 30, women should have HPV testing at the same frequency as a Pap test.  Colorectal cancer can be detected and often prevented. Most routine colorectal cancer screening begins at the age of 50 and continues through age 75. However, your caregiver may recommend screening at an earlier age if you have risk factors for colon cancer. On a yearly basis, your caregiver may provide home test kits to check for hidden blood in the stool. Use of a small camera at the end of a tube, to directly examine the colon (sigmoidoscopy or colonoscopy), can detect the earliest forms of colorectal cancer. Talk to your caregiver about this at age 50, when routine screening begins. Direct examination of the colon should be repeated every 5 to 10 years through age 75, unless early forms of pre-cancerous polyps or small growths are found.  Hepatitis C blood testing is recommended for all people born from 1945 through 1965 and any individual with known risks for hepatitis C.  Practice  safe sex. Use condoms and avoid high-risk sexual practices to reduce the spread of sexually transmitted infections (STIs). STIs include gonorrhea, chlamydia, syphilis, trichomonas, herpes, HPV, and human immunodeficiency virus (HIV). Herpes, HIV, and HPV are viral illnesses that have no cure. They can result in disability, cancer, and death. Sexually active women aged 25 and younger should be checked for chlamydia. Older women with new or multiple partners should also be tested for chlamydia. Testing for other STIs is recommended if you are sexually active and at increased risk.  Osteoporosis is a disease in which the bones lose minerals and strength with aging. This can result in serious bone fractures. The risk of osteoporosis can be identified using a bone density scan. Women ages 65 and over and women at risk for fractures or osteoporosis should discuss screening with their caregivers. Ask your caregiver whether you should take a calcium supplement or vitamin D to reduce the rate of osteoporosis.  Menopause can be associated with physical symptoms and risks. Hormone replacement therapy is available to decrease symptoms and risks. You should talk to your caregiver about whether hormone replacement therapy is right for you.  Use sunscreen with sun protection factor (SPF) of 30 or more. Apply sunscreen liberally and repeatedly throughout the day. You should seek shade when your shadow is shorter than you. Protect yourself by wearing long sleeves, pants, a wide-brimmed hat, and sunglasses year round, whenever you are outdoors.  Once a month, do a whole body skin exam, using a mirror to look at the skin on your back. Notify your caregiver of new moles, moles that have irregular borders, moles that are larger than a pencil eraser, or moles that have changed in shape or color.  Stay current with required immunizations.  Influenza. You need a dose every fall (or winter). The composition of the flu vaccine  changes each year, so being vaccinated once is not enough.  Pneumococcal polysaccharide. You need 1 to 2 doses if you smoke cigarettes or if you have certain chronic medical conditions. You need 1 dose at age 65 (or older) if you have never been vaccinated.  Tetanus, diphtheria, pertussis (Tdap, Td). Get 1 dose of Tdap vaccine if you are younger than age 65, are over 65 and have contact with an infant, are a healthcare worker, are pregnant, or simply want to be protected from whooping cough. After that, you need a Td   booster dose every 10 years. Consult your caregiver if you have not had at least 3 tetanus and diphtheria-containing shots sometime in your life or have a deep or dirty wound.  HPV. You need this vaccine if you are a woman age 26 or younger. The vaccine is given in 3 doses over 6 months.  Measles, mumps, rubella (MMR). You need at least 1 dose of MMR if you were born in 1957 or later. You may also need a second dose.  Meningococcal. If you are age 19 to 21 and a first-year college student living in a residence hall, or have one of several medical conditions, you need to get vaccinated against meningococcal disease. You may also need additional booster doses.  Zoster (shingles). If you are age 60 or older, you should get this vaccine.  Varicella (chickenpox). If you have never had chickenpox or you were vaccinated but received only 1 dose, talk to your caregiver to find out if you need this vaccine.  Hepatitis A. You need this vaccine if you have a specific risk factor for hepatitis A virus infection or you simply wish to be protected from this disease. The vaccine is usually given as 2 doses, 6 to 18 months apart.  Hepatitis B. You need this vaccine if you have a specific risk factor for hepatitis B virus infection or you simply wish to be protected from this disease. The vaccine is given in 3 doses, usually over 6 months. Preventive Services / Frequency Ages 19 to 39  Blood  pressure check.** / Every 1 to 2 years.  Lipid and cholesterol check.** / Every 5 years beginning at age 20.  Clinical breast exam.** / Every 3 years for women in their 20s and 30s.  Pap test.** / Every 2 years from ages 21 through 29. Every 3 years starting at age 30 through age 65 or 70 with a history of 3 consecutive normal Pap tests.  HPV screening.** / Every 3 years from ages 30 through ages 65 to 70 with a history of 3 consecutive normal Pap tests.  Hepatitis C blood test.** / For any individual with known risks for hepatitis C.  Skin self-exam. / Monthly.  Influenza immunization.** / Every year.  Pneumococcal polysaccharide immunization.** / 1 to 2 doses if you smoke cigarettes or if you have certain chronic medical conditions.  Tetanus, diphtheria, pertussis (Tdap, Td) immunization. / A one-time dose of Tdap vaccine. After that, you need a Td booster dose every 10 years.  HPV immunization. / 3 doses over 6 months, if you are 26 and younger.  Measles, mumps, rubella (MMR) immunization. / You need at least 1 dose of MMR if you were born in 1957 or later. You may also need a second dose.  Meningococcal immunization. / 1 dose if you are age 19 to 21 and a first-year college student living in a residence hall, or have one of several medical conditions, you need to get vaccinated against meningococcal disease. You may also need additional booster doses.  Varicella immunization.** / Consult your caregiver.  Hepatitis A immunization.** / Consult your caregiver. 2 doses, 6 to 18 months apart.  Hepatitis B immunization.** / Consult your caregiver. 3 doses usually over 6 months. Ages 40 to 64  Blood pressure check.** / Every 1 to 2 years.  Lipid and cholesterol check.** / Every 5 years beginning at age 20.  Clinical breast exam.** / Every year after age 40.  Mammogram.** / Every year beginning at age 40   and continuing for as long as you are in good health. Consult with your  caregiver.  Pap test.** / Every 3 years starting at age 30 through age 65 or 70 with a history of 3 consecutive normal Pap tests.  HPV screening.** / Every 3 years from ages 30 through ages 65 to 70 with a history of 3 consecutive normal Pap tests.  Fecal occult blood test (FOBT) of stool. / Every year beginning at age 50 and continuing until age 75. You may not need to do this test if you get a colonoscopy every 10 years.  Flexible sigmoidoscopy or colonoscopy.** / Every 5 years for a flexible sigmoidoscopy or every 10 years for a colonoscopy beginning at age 50 and continuing until age 75.  Hepatitis C blood test.** / For all people born from 1945 through 1965 and any individual with known risks for hepatitis C.  Skin self-exam. / Monthly.  Influenza immunization.** / Every year.  Pneumococcal polysaccharide immunization.** / 1 to 2 doses if you smoke cigarettes or if you have certain chronic medical conditions.  Tetanus, diphtheria, pertussis (Tdap, Td) immunization.** / A one-time dose of Tdap vaccine. After that, you need a Td booster dose every 10 years.  Measles, mumps, rubella (MMR) immunization. / You need at least 1 dose of MMR if you were born in 1957 or later. You may also need a second dose.  Varicella immunization.** / Consult your caregiver.  Meningococcal immunization.** / Consult your caregiver.  Hepatitis A immunization.** / Consult your caregiver. 2 doses, 6 to 18 months apart.  Hepatitis B immunization.** / Consult your caregiver. 3 doses, usually over 6 months. Ages 65 and over  Blood pressure check.** / Every 1 to 2 years.  Lipid and cholesterol check.** / Every 5 years beginning at age 20.  Clinical breast exam.** / Every year after age 40.  Mammogram.** / Every year beginning at age 40 and continuing for as long as you are in good health. Consult with your caregiver.  Pap test.** / Every 3 years starting at age 30 through age 65 or 70 with a 3  consecutive normal Pap tests. Testing can be stopped between 65 and 70 with 3 consecutive normal Pap tests and no abnormal Pap or HPV tests in the past 10 years.  HPV screening.** / Every 3 years from ages 30 through ages 65 or 70 with a history of 3 consecutive normal Pap tests. Testing can be stopped between 65 and 70 with 3 consecutive normal Pap tests and no abnormal Pap or HPV tests in the past 10 years.  Fecal occult blood test (FOBT) of stool. / Every year beginning at age 50 and continuing until age 75. You may not need to do this test if you get a colonoscopy every 10 years.  Flexible sigmoidoscopy or colonoscopy.** / Every 5 years for a flexible sigmoidoscopy or every 10 years for a colonoscopy beginning at age 50 and continuing until age 75.  Hepatitis C blood test.** / For all people born from 1945 through 1965 and any individual with known risks for hepatitis C.  Osteoporosis screening.** / A one-time screening for women ages 65 and over and women at risk for fractures or osteoporosis.  Skin self-exam. / Monthly.  Influenza immunization.** / Every year.  Pneumococcal polysaccharide immunization.** / 1 dose at age 65 (or older) if you have never been vaccinated.  Tetanus, diphtheria, pertussis (Tdap, Td) immunization. / A one-time dose of Tdap vaccine if you are over   65 and have contact with an infant, are a healthcare worker, or simply want to be protected from whooping cough. After that, you need a Td booster dose every 10 years.  Varicella immunization.** / Consult your caregiver.  Meningococcal immunization.** / Consult your caregiver.  Hepatitis A immunization.** / Consult your caregiver. 2 doses, 6 to 18 months apart.  Hepatitis B immunization.** / Check with your caregiver. 3 doses, usually over 6 months. ** Family history and personal history of risk and conditions may change your caregiver's recommendations. Document Released: 09/28/2001 Document Revised: 10/25/2011  Document Reviewed: 12/28/2010 [employer] Patient Information 2014 [employer].  

## 2013-05-30 NOTE — Progress Notes (Signed)
  Subjective:     Linda Weber is a 27 y.o. female G0P0 with LMP 05/19/2013 and BMI 49 who is here for a comprehensive physical exam. The patient reports no problems. Patient reports normal cycles since stopping OCP. She is still actively trying to conceive at this time. Patient noted to have HTN and reports occasional HTN, has not been evaluated by a provider.  History   Social History  . Marital Status: Single    Spouse Name: N/A    Number of Children: N/A  . Years of Education: N/A   Occupational History  . Not on file.   Social History Main Topics  . Smoking status: Current Every Day Smoker -- 1.00 packs/day for 10 years  . Smokeless tobacco: Never Used  . Alcohol Use: Yes  . Drug Use: No  . Sexual Activity: Yes    Birth Control/ Protection: Pill   Other Topics Concern  . Not on file   Social History Narrative  . No narrative on file   Health Maintenance  Topic Date Due  . Pap Smear  11/02/2004  . Tetanus/tdap  11/02/2005  . Influenza Vaccine  03/16/2013       Review of Systems A comprehensive review of systems was negative.   Objective:      GENERAL: Well-developed, well-nourished female in no acute distress.  HEENT: Normocephalic, atraumatic. Sclerae anicteric.  NECK: Supple. Normal thyroid.  LUNGS: Clear to auscultation bilaterally.  HEART: Regular rate and rhythm. BREASTS: Symmetric in size. No palpable masses or lymphadenopathy, skin changes, or nipple drainage. ABDOMEN: Soft, nontender, nondistended. Obese PELVIC: Normal external female genitalia. Vagina is pink and rugated.  Normal discharge. Normal appearing cervix. Uterus is normal in size. No adnexal mass or tenderness. EXTREMITIES: No cyanosis, clubbing, or edema, 2+ distal pulses.    Assessment:    Healthy female exam.      Plan:    Pap smear performed Patient requested full STI testing Referral to [facility] clinic for evaluation /management of HTN Continue daily PNV See After Visit Summary for  Counseling Recommendations

## 2013-05-31 LAB — HEPATITIS B SURFACE ANTIGEN: Hepatitis B Surface Ag: NEGATIVE

## 2013-06-07 ENCOUNTER — Encounter: Payer: Self-pay | Admitting: *Deleted

## 2013-06-15 ENCOUNTER — Ambulatory Visit: Payer: Self-pay

## 2013-06-29 ENCOUNTER — Ambulatory Visit: Payer: Self-pay | Admitting: Family Medicine

## 2013-07-24 ENCOUNTER — Encounter: Payer: Self-pay | Admitting: Family Medicine

## 2013-07-24 ENCOUNTER — Ambulatory Visit (INDEPENDENT_AMBULATORY_CARE_PROVIDER_SITE_OTHER): Payer: Self-pay | Admitting: Family Medicine

## 2013-07-24 VITALS — BP 134/85 | HR 69 | Temp 98.3°F | Wt 275.0 lb

## 2013-07-24 DIAGNOSIS — E669 Obesity, unspecified: Secondary | ICD-10-CM

## 2013-07-24 DIAGNOSIS — F17200 Nicotine dependence, unspecified, uncomplicated: Secondary | ICD-10-CM | POA: Insufficient documentation

## 2013-07-24 DIAGNOSIS — R4586 Emotional lability: Secondary | ICD-10-CM

## 2013-07-24 DIAGNOSIS — F1290 Cannabis use, unspecified, uncomplicated: Secondary | ICD-10-CM

## 2013-07-24 DIAGNOSIS — F39 Unspecified mood [affective] disorder: Secondary | ICD-10-CM

## 2013-07-24 DIAGNOSIS — F1210 Cannabis abuse, uncomplicated: Secondary | ICD-10-CM

## 2013-07-24 HISTORY — DX: Emotional lability: R45.86

## 2013-07-24 HISTORY — DX: Nicotine dependence, unspecified, uncomplicated: F17.200

## 2013-07-24 HISTORY — DX: Cannabis use, unspecified, uncomplicated: F12.90

## 2013-07-24 LAB — CBC
HCT: 34.4 % — ABNORMAL LOW (ref 36.0–46.0)
MCV: 67.5 fL — ABNORMAL LOW (ref 78.0–100.0)
RBC: 5.1 MIL/uL (ref 3.87–5.11)
WBC: 5.4 10*3/uL (ref 4.0–10.5)

## 2013-07-24 LAB — COMPREHENSIVE METABOLIC PANEL
ALT: 9 U/L (ref 0–35)
AST: 13 U/L (ref 0–37)
BUN: 10 mg/dL (ref 6–23)
CO2: 29 mEq/L (ref 19–32)
Calcium: 9.3 mg/dL (ref 8.4–10.5)
Creat: 0.71 mg/dL (ref 0.50–1.10)
Total Bilirubin: 0.5 mg/dL (ref 0.3–1.2)

## 2013-07-24 LAB — LIPID PANEL
Cholesterol: 138 mg/dL (ref 0–200)
HDL: 41 mg/dL (ref >39)
LDL Cholesterol: 84 mg/dL (ref 0–99)
Triglycerides: 64 mg/dL (ref <150)
VLDL: 13 mg/dL (ref 0–40)

## 2013-07-24 NOTE — Progress Notes (Signed)
  Patient name: Linda Weber MRN [id number]  Date of birth: 1986-08-01  CC & HPI:  SABREENA VOGAN is a 27 y.o. female presenting today to establish care.  She denies any acute problems and is not on any medication.  Blood pressure He reports previously elevated blood pressures at past visit, but most of these have been in the setting of acute pain or infection. Denies any current chest pain, shortness of breath or lower extremity swelling.   Anxiety/ depression She reports many symptoms of anxiety and depression that fluctuate on a daily basis, and says, "I, think I'm bipolar it runs in my family."  He endorses sadness, anxiety, sleep problems, stress, memory, problem, decreased energy, weight gain, and depression.  She denies current suicidal ideation or previous suicidal attempts.  She is not interested in any counseling or psychological referral today.  He reports that she is not interested in taking any pills. She reports current tobacco use at one to 2 packs a day, current marijuana use, and previous cocaine use.  Tobacco and illicit drug She reports one to 2 pack per day smoking history since she was 17, current marijuana and cocaine use in the past.  She reports wanting to quit, but not interested in counseling or medications to assist with this at that time.  She is concerned about her mood and weight if she attempts to quit these, but is aware a have associated risk.  Menstrual irregularity She reports a history of irregular menstrual cycle, but states is currently regular for the past several.  He is seeing an OB doctor for this and has had workup that revealed normal TSH, LH, FSH.    Birth control She is not currently taking any birth control and does not wish to be on birth control at this time due to fear of weight gain and her smoking.  She reports using condoms regularly. He denies any previous STD and has been tested for HIV, gonorrhea, Chlamydia, and syphilis. She denies taking any  current prenatal vitamin, but will consider this.  ROS: See HPI above otherwise negative.  Medical & Surgical Hx:  Reviewed.  Medications & Allergies: Reviewed & Updated - see associated section Social History: tobacco abuse, marijuana, and cocaine use  Objective Findings:  Vitals: BP 134/85  Pulse 69  Temp(Src) 98.3 F (36.8 C) (Oral)  Wt 275 lb (124.739 kg)  LMP 07/13/2013  Gen: NAD, obese Neuro: A&Ox4; Short & Long term memory intact; normal gait Head: Normocephalic/Atraumatic; Scalp w/o lesions Eyes:Sclera white; Conjunctiva pink; PERRLA; EOMI;  Ears: TMs clear; Canals w/o lacerations; No external lesions Nose: Mucosa pink; No sinus tenderness Throat: Oral mucosa pink, Pharynx w/o exudates CV: RRR w/o m/r/g, pulses +2 b/l Resp: CTAB w/ normal respiratory effort  Assessment & Plan:   Please See Problem Focused Assessment & Plan

## 2013-07-24 NOTE — Assessment & Plan Note (Addendum)
Assessment - TSH within normal limits - check lipid panel, cmet Plan  Discussed nutritional counseling, but she's not interested at this time.   Trying to exercise "walk" more

## 2013-07-24 NOTE — Assessment & Plan Note (Signed)
-   recommended smoking cessation, but she is not interested at this time

## 2013-07-24 NOTE — Assessment & Plan Note (Signed)
-   reports weekly marijuana use, not interested in quitting at this time

## 2013-07-24 NOTE — Patient Instructions (Addendum)
It was great seeing you today.   1. We will check some labs today, and notify you of the results.   Please bring all your medications to every doctors visit  Sign up for My Chart to have easy access to your labs results, and communication with your Primary care physician.   Please check-out at the front desk before leaving the clinic.   I look forward to talking with you again at our next visit. If you have any questions or concerns before then, please call the clinic at [phone].  Take Care,   Dr Wenda Low

## 2013-07-24 NOTE — Assessment & Plan Note (Addendum)
Pertinent S&O  Daily fluctuations in mood; Hx illicit drug use: No SI: "bipolar runs in the family" Assessment  Check CBC, vitamin D  she is not interested in counseling or medications at this time Plan  Reassess at next visit

## 2013-07-26 ENCOUNTER — Encounter: Payer: Self-pay | Admitting: Family Medicine

## 2013-08-23 ENCOUNTER — Ambulatory Visit: Payer: Self-pay | Admitting: Family Medicine

## 2014-08-21 ENCOUNTER — Encounter (HOSPITAL_COMMUNITY): Payer: Self-pay | Admitting: Family Medicine

## 2014-08-21 ENCOUNTER — Emergency Department (HOSPITAL_COMMUNITY)
Admission: EM | Admit: 2014-08-21 | Discharge: 2014-08-21 | Disposition: A | Discharge status: Home / Self Care | Payer: Self-pay | Attending: Emergency Medicine | Admitting: Emergency Medicine

## 2014-08-21 DIAGNOSIS — K5900 Constipation, unspecified: Secondary | ICD-10-CM | POA: Insufficient documentation

## 2014-08-21 DIAGNOSIS — K625 Hemorrhage of anus and rectum: Secondary | ICD-10-CM

## 2014-08-21 DIAGNOSIS — Z720 Tobacco use: Secondary | ICD-10-CM | POA: Insufficient documentation

## 2014-08-21 DIAGNOSIS — K649 Unspecified hemorrhoids: Secondary | ICD-10-CM | POA: Insufficient documentation

## 2014-08-21 NOTE — ED Provider Notes (Signed)
CSN: [id number]     Arrival date & time 08/21/14  1730 History   First MD Initiated Contact with Patient 08/21/14 1847     Chief Complaint  Patient presents with  . Rectal Bleeding     (Consider location/radiation/quality/duration/timing/severity/associated sxs/prior Treatment) Patient is a 29 y.o. female presenting with hematochezia.  Rectal Bleeding Quality:  Bright red Amount:  Scant (on tissue paper) Duration:  1 week Timing:  Constant Progression:  Unchanged Chronicity:  New Context: constipation   Context: not anal fissures, not anal penetration, not diarrhea, not rectal injury and not rectal pain   Similar prior episodes: no   Relieved by:  Nothing Worsened by:  Nothing tried Ineffective treatments:  None tried Associated symptoms: no abdominal pain, no dizziness, no fever, no hematemesis, no light-headedness, no loss of consciousness and no vomiting   Risk factors: no anticoagulant use  NSAID use: occasional.     Past Medical History  Diagnosis Date  . Abnormal Pap smear    History reviewed. No pertinent past surgical history. Family History  Problem Relation Age of Onset  . Diabetes Mother   . Hypertension Mother   . Breast cancer Maternal Grandmother    History  Substance Use Topics  . Smoking status: Current Every Day Smoker -- 1.00 packs/day for 10 years    Types: Cigarettes    Start date: 07/25/2003  . Smokeless tobacco: Never Used  . Alcohol Use: Yes   OB History    Gravida Para Term Preterm AB TAB SAB Ectopic Multiple Living   0         0     Review of Systems  Constitutional: Negative for fever.  HENT: Negative for sore throat.   Eyes: Negative for visual disturbance.  Respiratory: Negative for cough and shortness of breath.   Cardiovascular: Negative for chest pain.  Gastrointestinal: Positive for constipation, hematochezia and anal bleeding. Negative for nausea, vomiting, abdominal pain, diarrhea, blood in stool and hematemesis.       NO  HEMATOCHEZIA  Genitourinary: Negative for vaginal bleeding, vaginal discharge and difficulty urinating.  Musculoskeletal: Negative for back pain and neck pain.  Skin: Negative for rash.  Neurological: Negative for dizziness, loss of consciousness, syncope, light-headedness and headaches.      Allergies  Benadryl  Home Medications   Prior to Admission medications   Not on File   BP 116/72 mmHg  Pulse 76  Temp(Src) 98.3 F (36.8 C)  Resp 18  SpO2 99%  LMP 08/01/2014 Physical Exam  Constitutional: She is oriented to person, place, and time. She appears well-developed and well-nourished. No distress.  HENT:  Head: Normocephalic and atraumatic.  Eyes: Conjunctivae and EOM are normal.  Neck: Normal range of motion.  Cardiovascular: Normal rate, regular rhythm, normal heart sounds and intact distal pulses.  Exam reveals no gallop and no friction rub.   No murmur heard. Pulmonary/Chest: Effort normal and breath sounds normal. No respiratory distress. She has no wheezes. She has no rales.  Abdominal: Soft. She exhibits no distension. There is no tenderness. There is no guarding.  Genitourinary: Rectal exam shows external hemorrhoid. Rectal exam shows no fissure and anal tone normal.  Musculoskeletal: She exhibits no edema or tenderness.  Neurological: She is alert and oriented to person, place, and time.  Skin: Skin is warm and dry. No rash noted. She is not diaphoretic. No erythema.  Nursing note and vitals reviewed.   ED Course  Procedures (including critical care time) Labs Review Labs Reviewed -  No data to display  Imaging Review No results found.   EKG Interpretation None      MDM   Final diagnoses:  Hemorrhoids, unspecified hemorrhoid type  Rectal bleeding   29 year old female with no significant medical history presents with concern of bright red blood on tissue paper for 1 week.  Patient does not describe significant bleeding and low suspicion for anemia.  She does not have any melena or hematochezia and doubt upper GI bleed or lower abdominal tract source of bleeding.  No abdominal tenderness. History and physical exam most consistent with hemorrhoids. No significant tenderness or pain to indicate anal fissure.  Patient with nonthrombosed hemorrhoid on exam. Discussed taking sitz baths, increasing fiber and fluid intake, avoiding straining and using over-the-counter hemorrhoid creams or wipes.  Pt states understanding.  Patient discharged in stable condition with understanding of reasons to return.    Rhae Lerner, MD 08/22/14 [id number]  Merrie Roof, MD 08/24/14 [phone]

## 2014-08-21 NOTE — ED Notes (Signed)
Pt here for rectal bleeding when she wipes. sts bright red. Denies pain.

## 2014-08-22 NOTE — ED Provider Notes (Signed)
I saw and evaluated the patient, reviewed the resident's note and I agree with the findings and plan.   EKG Interpretation None      29 year old female presenting with chief complaint of blood on the toilet paper when she wipes for past 1 week.  On exam, well appearing, nontoxic, not distressed, normal respiratory effort, normal perfusion, abdomen soft and nontender.  Rectal exam performed by Dr. Dalene SeltzerSchlossman showed hemorrhoid, likely cause of small amount of bleeding that pt endorsed.  dc'd home with outpatient supportive treatment.   Clinical Impression: 1. Hemorrhoids, unspecified hemorrhoid type   2. Rectal bleeding       Candyce ChurnJohn David Karlin Binion III, MD 08/22/14 0030

## 2014-10-06 ENCOUNTER — Encounter (HOSPITAL_COMMUNITY): Payer: Self-pay | Admitting: *Deleted

## 2014-10-06 ENCOUNTER — Emergency Department (INDEPENDENT_AMBULATORY_CARE_PROVIDER_SITE_OTHER)
Admission: EM | Admit: 2014-10-06 | Discharge: 2014-10-06 | Disposition: A | Discharge status: Home / Self Care | Payer: Self-pay | Source: Home / Self Care | Attending: Family Medicine | Admitting: Family Medicine

## 2014-10-06 DIAGNOSIS — J039 Acute tonsillitis, unspecified: Secondary | ICD-10-CM

## 2014-10-06 HISTORY — DX: Obesity, unspecified: E66.9

## 2014-10-06 LAB — POCT RAPID STREP A: Streptococcus, Group A Screen (Direct): NEGATIVE

## 2014-10-06 MED ORDER — IBUPROFEN 100 MG/5ML PO SUSP
800.0000 mg | Freq: Once | ORAL | Status: AC
  Administered 2014-10-06: 800 mg via ORAL

## 2014-10-06 MED ORDER — CLINDAMYCIN HCL 300 MG PO CAPS: 300.0000 mg | ORAL_CAPSULE | Freq: Three times a day (TID) | ORAL | Status: DC

## 2014-10-06 MED ORDER — IBUPROFEN 100 MG/5ML PO SUSP
ORAL | Status: AC
  Filled 2014-10-06: qty 40

## 2014-10-06 NOTE — Discharge Instructions (Signed)
Thank you for coming in today. Take clindamycin as directed. Use Tylenol or ibuprofen or Aleve for pain control. Return as needed. Call or go to the emergency room if you get worse, have trouble breathing, have chest pains, or palpitations.   Tonsillitis Tonsillitis is an infection of the throat that causes the tonsils to become red, tender, and swollen. Tonsils are collections of lymphoid tissue at the back of the throat. Each tonsil has crevices (crypts). Tonsils help fight nose and throat infections and keep infection from spreading to other parts of the body for the first 18 months of life.  CAUSES Sudden (acute) tonsillitis is usually caused by infection with streptococcal bacteria. Long-lasting (chronic) tonsillitis occurs when the crypts of the tonsils become filled with pieces of food and bacteria, which makes it easy for the tonsils to become repeatedly infected. SYMPTOMS  Symptoms of tonsillitis include:  A sore throat, with possible difficulty swallowing.  White patches on the tonsils.  Fever.  Tiredness.  New episodes of snoring during sleep, when you did not snore before.  Small, foul-smelling, yellowish-white pieces of material (tonsilloliths) that you occasionally cough up or spit out. The tonsilloliths can also cause you to have bad breath. DIAGNOSIS Tonsillitis can be diagnosed through a physical exam. Diagnosis can be confirmed with the results of lab tests, including a throat culture. TREATMENT  The goals of tonsillitis treatment include the reduction of the severity and duration of symptoms and prevention of associated conditions. Symptoms of tonsillitis can be improved with the use of steroids to reduce the swelling. Tonsillitis caused by bacteria can be treated with antibiotic medicines. Usually, treatment with antibiotic medicines is started before the cause of the tonsillitis is known. However, if it is determined that the cause is not bacterial, antibiotic  medicines will not treat the tonsillitis. If attacks of tonsillitis are severe and frequent, your health care provider may recommend surgery to remove the tonsils (tonsillectomy). HOME CARE INSTRUCTIONS   Rest as much as possible and get plenty of sleep.  Drink plenty of fluids. While the throat is very sore, eat soft foods or liquids, such as sherbet, soups, or instant breakfast drinks.  Eat frozen ice pops.  Gargle with a warm or cold liquid to help soothe the throat. Mix 1/4 teaspoon of salt and 1/4 teaspoon of baking soda in 8 oz of water. SEEK MEDICAL CARE IF:   Large, tender lumps develop in your neck.  A rash develops.  A green, yellow-brown, or bloody substance is coughed up.  You are unable to swallow liquids or food for 24 hours.  You notice that only one of the tonsils is swollen. SEEK IMMEDIATE MEDICAL CARE IF:   You develop any new symptoms such as vomiting, severe headache, stiff neck, chest pain, or trouble breathing or swallowing.  You have severe throat pain along with drooling or voice changes.  You have severe pain, unrelieved with recommended medications.  You are unable to fully open the mouth.  You develop redness, swelling, or severe pain anywhere in the neck.  You have a fever. MAKE SURE YOU:   Understand these instructions.  Will watch your condition.  Will get help right away if you are not doing well or get worse. Document Released: 05/12/2005 Document Revised: 12/17/2013 Document Reviewed: 01/19/2013 [facility] Patient Information 2015 [place], [place]. This information is not intended to replace advice given to you by your health care provider. Make sure you discuss any questions you have with your health care provider.

## 2014-10-06 NOTE — ED Notes (Signed)
C/O severe sore throat x 2-3 days without fever.  Has not been taking any meds to help alleviate pain.  Tonsils enlarged, inflamed, with exudate noted.

## 2014-10-06 NOTE — ED Provider Notes (Addendum)
Linda Weber is a 29 y.o. female who presents to Urgent Care today for sore throat. Patient has a 2-3 day history of sore throat. No vomiting diarrhea cough or congestion. She has not tried any medications yet. The pain is severe and is interfering with swallowing. She is able to swallow normally however. She feels well otherwise.   Past Medical History  Diagnosis Date  . Abnormal Pap smear   . Obesity    History reviewed. No pertinent past surgical history. History  Substance Use Topics  . Smoking status: Current Every Day Smoker -- 1.00 packs/day for 10 years    Types: Cigarettes    Start date: 07/25/2003  . Smokeless tobacco: Never Used  . Alcohol Use: Yes     Comment: occasionally   ROS as above Medications: No current facility-administered medications for this encounter.   Current Outpatient Prescriptions  Medication Sig Dispense Refill  . clindamycin (CLEOCIN) 300 MG capsule Take 1 capsule (300 mg total) by mouth 3 (three) times daily. 30 capsule 0   Allergies  Allergen Reactions  . Benadryl [Diphenhydramine Hcl] Hives     Exam:  BP 132/88 mmHg  Pulse 85  Temp(Src) 99.2 F (37.3 C) (Oral)  Resp 18  SpO2 100%  LMP 09/26/2014 (Exact Date) Gen: Well NAD HEENT: EOMI,  MMM tonsillar hypertrophy with erythema Bilaterally. Uvula is midline. No trismus Normal Tympanic membranes Lungs: Normal work of breathing. CTABL Heart: RRR no MRG Abd: NABS, Soft. Nondistended, Nontender Exts: Brisk capillary refill, warm and well perfused.   Results for orders placed or performed during the hospital encounter of 10/06/14 (from the past 24 hour(s))  POCT rapid strep A [facility])     Status: None   Collection Time: 10/06/14 12:07 PM  Result Value Ref Range   Streptococcus, Group A Screen (Direct) NEGATIVE NEGATIVE   No results found.  Assessment and Plan: 29 y.o. female with tonsillitis treat with clindamycin  Discussed warning signs or symptoms. Please see discharge  instructions. Patient expresses understanding.     Rodolph BongEvan S Corey, MD 10/06/14 1229  Rodolph BongEvan S Corey, MD 10/06/14 [phone]

## 2014-10-09 LAB — CULTURE, GROUP A STREP

## 2014-10-13 ENCOUNTER — Telehealth (HOSPITAL_COMMUNITY): Payer: Self-pay | Admitting: *Deleted

## 2014-10-13 NOTE — ED Notes (Signed)
Pt. called back. Pt. verified x 2 and given result. Pt. Told she was adequately treated with Cleocin.  Pt. said she feels better but her throat is still scratchy. Instructed to come back if not better after the medicine of worsening in anyway.  If anyone she exposed gets the same symptoms, should get checked. Vassie MoselleYork, Maddock Finigan M 10/13/2014

## 2014-11-29 ENCOUNTER — Encounter: Payer: Self-pay | Admitting: Obstetrics and Gynecology

## 2014-11-29 ENCOUNTER — Other Ambulatory Visit: Payer: Self-pay | Admitting: Obstetrics and Gynecology

## 2014-11-29 ENCOUNTER — Ambulatory Visit (INDEPENDENT_AMBULATORY_CARE_PROVIDER_SITE_OTHER): Payer: 59 | Admitting: Obstetrics and Gynecology

## 2014-11-29 VITALS — BP 129/77 | HR 74 | Temp 98.5°F | Ht 62.0 in | Wt 269.4 lb

## 2014-11-29 DIAGNOSIS — Z113 Encounter for screening for infections with a predominantly sexual mode of transmission: Secondary | ICD-10-CM | POA: Diagnosis not present

## 2014-11-29 DIAGNOSIS — Z01419 Encounter for gynecological examination (general) (routine) without abnormal findings: Secondary | ICD-10-CM

## 2014-11-29 DIAGNOSIS — Z118 Encounter for screening for other infectious and parasitic diseases: Secondary | ICD-10-CM | POA: Diagnosis not present

## 2014-11-29 LAB — HEPATITIS C ANTIBODY: HCV Ab: NEGATIVE

## 2014-11-29 LAB — HEPATITIS B SURFACE ANTIGEN: Hepatitis B Surface Ag: NEGATIVE

## 2014-11-29 NOTE — Progress Notes (Signed)
  Subjective:     Linda Weber is a 29 y.o. female with LMP 11/22/2014 and BMI 49 who is here for a comprehensive physical exam. The patient reports no problems. Patient is sexually active using condoms and is not interested in any other forms of contraception. Patient desires full STD testing. Patient is otherwise without any other complaints.  History   Social History  . Marital Status: Single    Spouse Name: N/A  . Number of Children: N/A  . Years of Education: N/A   Occupational History  . Not on file.   Social History Main Topics  . Smoking status: Current Every Day Smoker -- 1.00 packs/day for 10 years    Types: Cigarettes    Start date: 07/25/2003  . Smokeless tobacco: Never Used  . Alcohol Use: Yes     Comment: occasionally  . Drug Use: No     Comment: Hx cocaine & marijuana use  . Sexual Activity: Yes    Birth Control/ Protection: None   Other Topics Concern  . Not on file   Social History Narrative   Health Maintenance  Topic Date Due  . Janet BerlinETANUS/TDAP  11/02/2005  . INFLUENZA VACCINE  03/17/2015  . PAP SMEAR  05/30/2016  . HIV Screening  Completed       Review of Systems Pertinent items are noted in HPI.   Objective:      GENERAL: Well-developed, well-nourished female in no acute distress.  HEENT: Normocephalic, atraumatic. Sclerae anicteric.  NECK: Supple. Normal thyroid.  LUNGS: Clear to auscultation bilaterally.  HEART: Regular rate and rhythm. BREASTS: Symmetric in size. No palpable masses or lymphadenopathy, skin changes, or nipple drainage. ABDOMEN: Soft, nontender, nondistended. No organomegaly. PELVIC: Normal external female genitalia, with small laceration noted on labia minora. Vagina is pink and rugated.  Normal discharge. Normal appearing cervix. Uterus is normal in size. No adnexal mass or tenderness. EXTREMITIES: No cyanosis, clubbing, or edema, 2+ distal pulses.    Assessment:    Healthy female exam.      Plan:    Gonorrhea/chlamydia collected per patient request Wet prep collected Patient desires HIV, Syphilis and hepatitis testing Patient advised to perform monthly self breast and vulva exams Patient will be contacted with any abnormal results Weight loss regimens discussed See After Visit Summary for Counseling Recommendations

## 2014-11-30 LAB — WET PREP, GENITAL
TRICH WET PREP: NONE SEEN
YEAST WET PREP: NONE SEEN

## 2014-11-30 LAB — GC/CHLAMYDIA PROBE AMP
CT Probe RNA: NEGATIVE
GC PROBE AMP APTIMA: NEGATIVE

## 2014-11-30 LAB — RPR

## 2014-11-30 LAB — HIV ANTIBODY (ROUTINE TESTING W REFLEX): HIV: NONREACTIVE

## 2014-12-02 ENCOUNTER — Telehealth: Payer: Self-pay

## 2014-12-02 MED ORDER — METRONIDAZOLE 500 MG PO TABS: 500.0000 mg | ORAL_TABLET | Freq: Two times a day (BID) | ORAL | Status: DC

## 2014-12-02 NOTE — Telephone Encounter (Signed)
Called patient. Woman answered and stated she is not available but will have patient call when she gets home.

## 2014-12-02 NOTE — Telephone Encounter (Signed)
Linda Weber called back, informed her of negative STD, +BV , and rx sent.No other questions

## 2014-12-02 NOTE — Addendum Note (Signed)
Addended by: Catalina AntiguaONSTANT, Kynnadi Dicenso on: 12/02/2014 10:47 AM   Modules accepted: Orders

## 2014-12-02 NOTE — Telephone Encounter (Signed)
-----   Message from Catalina AntiguaPeggy Constant, MD sent at 12/02/2014 10:47 AM EDT ----- Please inform patient of negative STD screen. Patient does have BV and flagyl has been e-prescribed  AnimatorThanks  [name]

## 2015-02-23 ENCOUNTER — Emergency Department (HOSPITAL_COMMUNITY)
Admission: EM | Admit: 2015-02-23 | Discharge: 2015-02-23 | Disposition: A | Discharge status: Home / Self Care | Payer: Self-pay | Attending: Emergency Medicine | Admitting: Emergency Medicine

## 2015-02-23 ENCOUNTER — Encounter (HOSPITAL_COMMUNITY): Payer: Self-pay

## 2015-02-23 DIAGNOSIS — G479 Sleep disorder, unspecified: Secondary | ICD-10-CM | POA: Insufficient documentation

## 2015-02-23 DIAGNOSIS — Y9389 Activity, other specified: Secondary | ICD-10-CM | POA: Insufficient documentation

## 2015-02-23 DIAGNOSIS — K0889 Other specified disorders of teeth and supporting structures: Secondary | ICD-10-CM

## 2015-02-23 DIAGNOSIS — Y998 Other external cause status: Secondary | ICD-10-CM | POA: Insufficient documentation

## 2015-02-23 DIAGNOSIS — Y929 Unspecified place or not applicable: Secondary | ICD-10-CM | POA: Insufficient documentation

## 2015-02-23 DIAGNOSIS — Z720 Tobacco use: Secondary | ICD-10-CM | POA: Insufficient documentation

## 2015-02-23 DIAGNOSIS — E669 Obesity, unspecified: Secondary | ICD-10-CM | POA: Insufficient documentation

## 2015-02-23 DIAGNOSIS — S0993XA Unspecified injury of face, initial encounter: Secondary | ICD-10-CM | POA: Insufficient documentation

## 2015-02-23 DIAGNOSIS — X58XXXA Exposure to other specified factors, initial encounter: Secondary | ICD-10-CM | POA: Insufficient documentation

## 2015-02-23 DIAGNOSIS — Z792 Long term (current) use of antibiotics: Secondary | ICD-10-CM | POA: Insufficient documentation

## 2015-02-23 DIAGNOSIS — K0381 Cracked tooth: Secondary | ICD-10-CM | POA: Insufficient documentation

## 2015-02-23 MED ORDER — PENICILLIN V POTASSIUM 250 MG PO TABS: 250.0000 mg | ORAL_TABLET | Freq: Four times a day (QID) | ORAL | Status: AC

## 2015-02-23 NOTE — Discharge Instructions (Signed)

## 2015-02-23 NOTE — ED Notes (Signed)
Pt stable, ambulatory, states understanding of discharge instructions 

## 2015-02-23 NOTE — ED Provider Notes (Signed)
CSN: [id number]     Arrival date & time 02/23/15  1903 History  This chart was scribed for Roxy Horseman, PA-C, working with Lorre Nick, MD by Elon Spanner, ED Scribe. This patient was seen in room TR08C/TR08C and the patient's care was started at 7:28 PM.   Chief Complaint  Patient presents with  . Dental Pain   The history is provided by the patient. No language interpreter was used.   HPI Comments: Linda Weber is a 29 y.o. female who presents to the Emergency Department complaining of right-sided dental pain.  The patient reports she cracked her tooth six months ago and has had mild pain since.  The complaint worsened 1 week ago and last night, while brushing, the patient heard a pop and then smelled a foul smell.  The sensation of pressure that she was experiencing prior resolved after the pop.    Past Medical History  Diagnosis Date  . Abnormal Pap smear   . Obesity    History reviewed. No pertinent past surgical history. Family History  Problem Relation Age of Onset  . Diabetes Mother   . Hypertension Mother   . Breast cancer Maternal Grandmother    History  Substance Use Topics  . Smoking status: Current Every Day Smoker -- 1.00 packs/day for 10 years    Types: Cigarettes    Start date: 07/25/2003  . Smokeless tobacco: Never Used  . Alcohol Use: Yes     Comment: occasionally   OB History    Gravida Para Term Preterm AB TAB SAB Ectopic Multiple Living   0         0     Review of Systems  Constitutional: Negative for fever and chills.  HENT: Positive for dental problem. Negative for drooling.   Neurological: Negative for speech difficulty.  Psychiatric/Behavioral: Positive for sleep disturbance.      Allergies  Benadryl  Home Medications   Prior to Admission medications   Medication Sig Start Date End Date Taking? Authorizing Provider  metroNIDAZOLE (FLAGYL) 500 MG tablet Take 1 tablet (500 mg total) by mouth 2 (two) times daily. 12/02/14   [name]  Constant, MD   BP 135/71 mmHg  Pulse 86  Temp(Src) 98.2 F (36.8 C) (Oral)  Resp 22  Ht  (1.575 m)  Wt 272 lb (123.378 kg)  BMI 49.74 kg/m2  SpO2 98% Physical Exam  Constitutional: She is oriented to person, place, and time. She appears well-developed and well-nourished. No distress.  HENT:  Head: Normocephalic and atraumatic.  Mouth/Throat:    Poor dentition throughout.  Affected tooth as diagrammed.  No signs of peritonsillar or tonsillar abscess.  No signs of gingival abscess. Oropharynx is clear and without exudates.  Uvula is midline.  Airway is intact. No signs of Ludwig's angina with palpation of oral and sublingual mucosa.   Eyes: Conjunctivae and EOM are normal.  Neck: Normal range of motion. Neck supple. No tracheal deviation present.  Cardiovascular: Normal rate.   Pulmonary/Chest: Effort normal. No respiratory distress.  Abdominal: She exhibits no distension.  Musculoskeletal: Normal range of motion.  Neurological: She is alert and oriented to person, place, and time.  Skin: Skin is warm and dry.  Psychiatric: She has a normal mood and affect. Her behavior is normal. Judgment and thought content normal.  Nursing note and vitals reviewed.   ED Course  Procedures (including critical care time)  DIAGNOSTIC STUDIES: Oxygen Saturation is 98% on RA, normal by my interpretation.  COORDINATION OF CARE:  7:33 PM Will prescribe antibiotic.  Will provide referral to dentist for f/u.  Patient acknowledges and agrees with plan.    Labs Review Labs Reviewed - No data to display  Imaging Review No results found.   EKG Interpretation None      MDM   Final diagnoses:  Pain, dental    Patient with toothache.  No gross abscess.  Exam unconcerning for Ludwig's angina or spread of infection.  Will treat with penicillin and OTC pain medicine.  Urged patient to follow-up with dentist.     I personally performed the services described in this documentation,  which was scribed in my presence. The recorded information has been reviewed and is accurate.     Roxy Horsemanobert Elridge Stemm, PA-C 02/23/15 1954  Lorre NickAnthony Allen, MD 02/23/15 [phone]

## 2015-02-23 NOTE — ED Notes (Signed)
Pt here for dental pain on right side, sts cracked tooth a few months ago and since then the pain has increased states she was brushing her teeth and she started having an odor and thought something may have popped. Has taken tylenol with relief.

## 2015-04-23 ENCOUNTER — Encounter (HOSPITAL_COMMUNITY): Payer: Self-pay | Admitting: Family Medicine

## 2015-04-23 ENCOUNTER — Emergency Department (HOSPITAL_COMMUNITY)
Admission: EM | Admit: 2015-04-23 | Discharge: 2015-04-23 | Disposition: A | Discharge status: Home / Self Care | Payer: Self-pay | Attending: Emergency Medicine | Admitting: Emergency Medicine

## 2015-04-23 DIAGNOSIS — Y998 Other external cause status: Secondary | ICD-10-CM | POA: Insufficient documentation

## 2015-04-23 DIAGNOSIS — S80861A Insect bite (nonvenomous), right lower leg, initial encounter: Secondary | ICD-10-CM | POA: Insufficient documentation

## 2015-04-23 DIAGNOSIS — S20369A Insect bite (nonvenomous) of unspecified front wall of thorax, initial encounter: Secondary | ICD-10-CM | POA: Insufficient documentation

## 2015-04-23 DIAGNOSIS — S40862A Insect bite (nonvenomous) of left upper arm, initial encounter: Secondary | ICD-10-CM | POA: Insufficient documentation

## 2015-04-23 DIAGNOSIS — Z720 Tobacco use: Secondary | ICD-10-CM | POA: Insufficient documentation

## 2015-04-23 DIAGNOSIS — E669 Obesity, unspecified: Secondary | ICD-10-CM | POA: Insufficient documentation

## 2015-04-23 DIAGNOSIS — Y9389 Activity, other specified: Secondary | ICD-10-CM | POA: Insufficient documentation

## 2015-04-23 DIAGNOSIS — W57XXXA Bitten or stung by nonvenomous insect and other nonvenomous arthropods, initial encounter: Secondary | ICD-10-CM | POA: Insufficient documentation

## 2015-04-23 DIAGNOSIS — S40861A Insect bite (nonvenomous) of right upper arm, initial encounter: Secondary | ICD-10-CM | POA: Insufficient documentation

## 2015-04-23 DIAGNOSIS — Z792 Long term (current) use of antibiotics: Secondary | ICD-10-CM | POA: Insufficient documentation

## 2015-04-23 DIAGNOSIS — Y9289 Other specified places as the place of occurrence of the external cause: Secondary | ICD-10-CM | POA: Insufficient documentation

## 2015-04-23 DIAGNOSIS — S80862A Insect bite (nonvenomous), left lower leg, initial encounter: Secondary | ICD-10-CM | POA: Insufficient documentation

## 2015-04-23 MED ORDER — LORATADINE 10 MG PO TABS
10.0000 mg | ORAL_TABLET | Freq: Once | ORAL | Status: DC
  Filled 2015-04-23: qty 1

## 2015-04-23 MED ORDER — HYDROCORTISONE 1 % EX CREA
TOPICAL_CREAM | Freq: Two times a day (BID) | CUTANEOUS | Status: DC
  Filled 2015-04-23: qty 28

## 2015-04-23 MED ORDER — LORATADINE 10 MG PO TABS: 10.0000 mg | ORAL_TABLET | Freq: Every day | ORAL | Status: DC

## 2015-04-23 NOTE — ED Provider Notes (Signed)
CSN: [id number]     Arrival date & time 04/23/15  1800 History  This chart was scribed for Linda Pel, PA-C, working with Linda Memos, MD by Linda Weber, ED Scribe. This patient was seen in room TR11C/TR11C and the patient's care was started at 7:00 PM.   Chief Complaint  Patient presents with  . Rash   The history is provided by the patient. No language interpreter was used.    HPI Comments: Linda Weber is a 29 y.o. female  With no significant hx who presents to the Emergency Department complaining of a generalized, itching rash onset 1 week ago, not relieved by OTC itching cream.  She does not recall any instance of mosquito bite, or any insect bites but reports she is outside frequently and wears shorts often.  She has had scabies and bed bugs but this does not have the same pattern or feel the same. She has not had any wheezing, coughing, fevers, chills. She has multiple bites and is without myalgias, arthralgias. The patient works at [employer].  Patient reports an allergy to benadryl.    Linda Weber 29 y.o.female  PCP: Weber,[name], MD  Blood pressure 138/89, pulse 72, temperature 98.2 F (36.8 C), temperature source Oral, resp. rate 16, last menstrual period 04/04/2015, SpO2 100 %.    Past Medical History  Diagnosis Date  . Abnormal Pap smear   . Obesity    History reviewed. No pertinent past surgical history. Family History  Problem Relation Age of Onset  . Diabetes Mother   . Hypertension Mother   . Breast cancer Maternal Grandmother    Social History  Substance Use Topics  . Smoking status: Current Every Day Smoker -- 1.00 packs/day for 10 years    Types: Cigarettes    Start date: 07/25/2003  . Smokeless tobacco: Never Used  . Alcohol Use: Yes     Comment: occasionally   OB History    Gravida Para Term Preterm AB TAB SAB Ectopic Multiple Living   0         0     Review of Systems  Confusion, diaphoresis, fever, headache, weakness (general or  focal), change of vision,  neck pain, dysphagia, aphagia, chest pain, shortness of breath,  back pain, abdominal pains, nausea, vomiting, diarrhea, lower extremity swelling.  Allergies  Benadryl  Home Medications   Prior to Admission medications   Medication Sig Start Date End Date Taking? Authorizing Provider  loratadine (CLARITIN) 10 MG tablet Take 1 tablet (10 mg total) by mouth daily. 04/23/15   Linda Weber Neva Seat, PA-C  metroNIDAZOLE (FLAGYL) 500 MG tablet Take 1 tablet (500 mg total) by mouth 2 (two) times daily. 12/02/14   Linda Constant, MD   BP 148/92 mmHg  Pulse 77  Temp(Src) 98.9 F (37.2 C) (Oral)  Resp 14  SpO2 100%  LMP 04/04/2015 Physical Exam  Constitutional: She is oriented to person, place, and time. She appears well-developed and well-nourished. No distress.  HENT:  Head: Normocephalic and atraumatic.  Eyes: Conjunctivae and EOM are normal.  Neck: Neck supple. No tracheal deviation present.  Cardiovascular: Normal rate.   Pulmonary/Chest: Effort normal and breath sounds normal. No accessory muscle usage. No respiratory distress. She has no decreased breath sounds. She has no wheezes. She has no rhonchi.  Abdominal: Bowel sounds are normal. There is no tenderness. There is no rigidity, no rebound and no guarding.  Musculoskeletal: Normal range of motion.  Neurological: She is alert and oriented to person,  place, and time.  Skin: Skin is warm and dry. Rash noted. Rash is urticarial.  Mosquito bites to chest, bilateral arms and legs. It spares hands, feet, neck, face, abdomen. They are indurated without drainage.   Psychiatric: She has a normal mood and affect. Her behavior is normal.  Nursing note and vitals reviewed.   ED Course  Procedures (including critical care time)  DIAGNOSTIC STUDIES: Oxygen Saturation is 100% on RA, normal by my interpretation.    COORDINATION OF CARE:  7:05 PM - Will prescribe Hydrocortisone cream and Claritin.  Patient acknowledges and  agrees with plan.    Labs Review Labs Reviewed - No data to display  Imaging Review No results found. I have personally reviewed and evaluated these images and lab results as part of my medical decision-making.   EKG Interpretation None       MDM   Final diagnoses:  Mosquito bite    Medications  loratadine (CLARITIN) tablet 10 mg (not administered)  hydrocortisone cream 1 % (not administered)    29 y.o.[name]'s evaluation in the Emergency Department is complete. It has been determined that no acute conditions requiring further emergency intervention are present at this time. The patient/guardian have been advised of the diagnosis and plan. We have discussed signs and symptoms that warrant return to the ED, such as changes or worsening in symptoms.  Vital signs are stable at discharge. Filed Vitals:   04/23/15 1823  BP: 148/92  Pulse: 77  Temp: 98.9 F (37.2 C)  Resp: 14    Patient/guardian has voiced understanding and agreed to follow-up with the PCP or specialist.   I personally performed the services described in this documentation, which was scribed in my presence. The recorded information has been reviewed and is accurate.    Linda Pel, PA-C 04/23/15 1922  Linda Pel, PA-C 04/24/15 2110  Linda Memos, MD 04/27/15 2106

## 2015-04-23 NOTE — ED Notes (Signed)
Pt sts allergic reaction to something. Pt itching and rash to legs and chest. Denies anything new.

## 2015-04-23 NOTE — Discharge Instructions (Signed)

## 2015-04-23 NOTE — ED Notes (Signed)
Patient left at this time with all belongings. 

## 2015-05-10 ENCOUNTER — Emergency Department (HOSPITAL_COMMUNITY)
Admission: EM | Admit: 2015-05-10 | Discharge: 2015-05-10 | Disposition: A | Discharge status: Home / Self Care | Payer: Self-pay | Attending: Emergency Medicine | Admitting: Emergency Medicine

## 2015-05-10 ENCOUNTER — Encounter (HOSPITAL_COMMUNITY): Payer: Self-pay | Admitting: *Deleted

## 2015-05-10 DIAGNOSIS — E669 Obesity, unspecified: Secondary | ICD-10-CM | POA: Insufficient documentation

## 2015-05-10 DIAGNOSIS — Z720 Tobacco use: Secondary | ICD-10-CM | POA: Insufficient documentation

## 2015-05-10 DIAGNOSIS — Z79899 Other long term (current) drug therapy: Secondary | ICD-10-CM | POA: Insufficient documentation

## 2015-05-10 DIAGNOSIS — R21 Rash and other nonspecific skin eruption: Secondary | ICD-10-CM | POA: Insufficient documentation

## 2015-05-10 MED ORDER — HYDROCORTISONE 2.5 % EX LOTN: TOPICAL_LOTION | Freq: Two times a day (BID) | CUTANEOUS | Status: DC

## 2015-05-10 NOTE — ED Notes (Signed)
Declined W/C at D/C and was escorted to lobby by RN. 

## 2015-05-10 NOTE — Discharge Instructions (Signed)
Insect Bite °Mosquitoes, flies, fleas, bedbugs, and many other insects can bite. Insect bites are different from insect stings. A sting is when venom is injected into the skin. Some insect bites can transmit infectious diseases. °SYMPTOMS  °Insect bites usually turn red, swell, and itch for 2 to 4 days. They often go away on their own. °TREATMENT  °Your caregiver may prescribe antibiotic medicines if a bacterial infection develops in the bite. °HOME CARE INSTRUCTIONS °· Do not scratch the bite area. °· Keep the bite area clean and dry. Wash the bite area thoroughly with soap and water. °· Put ice or cool compresses on the bite area. °· Put ice in a plastic bag. °· Place a towel between your skin and the bag. °· Leave the ice on for 20 minutes, 4 times a day for the first 2 to 3 days, or as directed. °· You may apply a baking soda paste, cortisone cream, or calamine lotion to the bite area as directed by your caregiver. This can help reduce itching and swelling. °· Only take over-the-counter or prescription medicines as directed by your caregiver. °· If you are given antibiotics, take them as directed. Finish them even if you start to feel better. °You may need a tetanus shot if: °· You cannot remember when you had your last tetanus shot. °· You have never had a tetanus shot. °· The injury broke your skin. °If you get a tetanus shot, your arm may swell, get red, and feel warm to the touch. This is common and not a problem. If you need a tetanus shot and you choose not to have one, there is a rare chance of getting tetanus. Sickness from tetanus can be serious. °SEEK IMMEDIATE MEDICAL CARE IF:  °· You have increased pain, redness, or swelling in the bite area. °· You see a red line on the skin coming from the bite. °· You have a fever. °· You have joint pain. °· You have a headache or neck pain. °· You have unusual weakness. °· You have a rash. °· You have chest pain or shortness of breath. °· You have abdominal pain,  nausea, or vomiting. °· You feel unusually tired or sleepy. °MAKE SURE YOU:  °· Understand these instructions. °· Will watch your condition. °· Will get help right away if you are not doing well or get worse. °Document Released: 09/09/2004 Document Revised: 10/25/2011 Document Reviewed: 03/03/2011 °ExitCare® Patient Information ©2015 [employer]. This information is not intended to replace advice given to you by your health care provider. Make sure you discuss any questions you have with your health care provider. ° ° ° °Rash °A rash is a change in the color or texture of your skin. There are many different types of rashes. You may have other problems that accompany your rash. °CAUSES  °· Infections. °· Allergic reactions. This can include allergies to pets or foods. °· Certain medicines. °· Exposure to certain chemicals, soaps, or cosmetics. °· Heat. °· Exposure to poisonous plants. °· Tumors, both cancerous and noncancerous. °SYMPTOMS  °· Redness. °· Scaly skin. °· Itchy skin. °· Dry or cracked skin. °· Bumps. °· Blisters. °· Pain. °DIAGNOSIS  °Your caregiver may do a physical exam to determine what type of rash you have. A skin sample (biopsy) may be taken and examined under a microscope. °TREATMENT  °Treatment depends on the type of rash you have. Your caregiver may prescribe certain medicines. For serious conditions, you may need to see a skin doctor (  dermatologist). °HOME CARE INSTRUCTIONS  °· Avoid the substance that caused your rash. °· Do not scratch your rash. This can cause infection. °· You may take cool baths to help stop itching. °· Only take over-the-counter or prescription medicines as directed by your caregiver. °· Keep all follow-up appointments as directed by your caregiver. °SEEK IMMEDIATE MEDICAL CARE IF: °· You have increasing pain, swelling, or redness. °· You have a fever. °· You have new or severe symptoms. °· You have body aches, diarrhea, or vomiting. °· Your rash is not better after 3  days. °MAKE SURE YOU: °· Understand these instructions. °· Will watch your condition. °· Will get help right away if you are not doing well or get worse. °Document Released: 07/23/2002 Document Revised: 10/25/2011 Document Reviewed: 05/17/2011 °ExitCare® Patient Information ©2015 [employer]. This information is not intended to replace advice given to you by your health care provider. Make sure you discuss any questions you have with your health care provider. ° ° °

## 2015-05-10 NOTE — ED Provider Notes (Signed)
CSN: [id number]     Arrival date & time 05/10/15  1191 History  This chart was scribed for non-physician practitioner, Will Marijo File, PA-C, working with Pricilla Loveless, MD, by Ronney Lion, ED Scribe. This patient was seen in room TR06C/TR06C and the patient's care was started at 10:23 AM.    Chief Complaint  Patient presents with  . Insect Bite   The history is provided by the patient. No language interpreter was used.    HPI Comments: Linda Weber is a 29 y.o. female who presents to the Emergency Department complaining of pruritic, small bumps on her right arm that resemble insect bites. She first noticed these yesterday, but she was also seen and evaluated about 2 weeks ago, on 04/23/15, for similar symptoms on her ankles, thighs, buttocks, and back. She originally thought she was having an allergic reaction, but she was told she may have mosquito bites. Patient has taken Zyrtec with minimal relief. Patient states she had not yet filled her prescription of hydrocortisone because she left before she was given the prescription. Patient at one point thought she might have bedbugs, but she did not see any bedbugs at home. She suspects she may have been exposed to fleas because her boyfriend has 3 dogs, but she states he does not have any symptoms. Patient states she lives alone. She denies contacts with similar symptoms. She denies fever, chills, cough, wheezing, SOB, abdominal pain, nausea, vomiting, arthralgias, myalgias, or throat-closing sensation. Patient notes an allergy to Benadryl.   Past Medical History  Diagnosis Date  . Abnormal Pap smear   . Obesity    History reviewed. No pertinent past surgical history. Family History  Problem Relation Age of Onset  . Diabetes Mother   . Hypertension Mother   . Breast cancer Maternal Grandmother    Social History  Substance Use Topics  . Smoking status: Current Every Day Smoker -- 1.00 packs/day for 10 years    Types: Cigarettes    Start date:  07/25/2003  . Smokeless tobacco: Never Used  . Alcohol Use: Yes     Comment: occasionally   OB History    Gravida Para Term Preterm AB TAB SAB Ectopic Multiple Living   0         0     Review of Systems  Constitutional: Negative for fever and chills.  HENT: Negative for postnasal drip, sneezing, sore throat and trouble swallowing.   Eyes: Negative for visual disturbance.  Respiratory: Negative for cough, chest tightness, shortness of breath and wheezing.   Cardiovascular: Negative for chest pain.  Gastrointestinal: Negative for nausea, vomiting and abdominal pain.  Musculoskeletal: Negative for myalgias and arthralgias.  Skin: Positive for rash. Negative for wound.  Neurological: Negative for headaches.      Allergies  Benadryl  Home Medications   Prior to Admission medications   Medication Sig Start Date End Date Taking? Authorizing Provider  hydrocortisone 2.5 % lotion Apply topically 2 (two) times daily. 05/10/15   Everlene Farrier, PA-C  loratadine (CLARITIN) 10 MG tablet Take 1 tablet (10 mg total) by mouth daily. 04/23/15   Tiffany Neva Seat, PA-C  metroNIDAZOLE (FLAGYL) 500 MG tablet Take 1 tablet (500 mg total) by mouth 2 (two) times daily. 12/02/14   [name] Constant, MD   BP 141/87 mmHg  Pulse 79  Temp(Src) 97.6 F (36.4 C) (Oral)  Resp 18  SpO2 100%  LMP 04/04/2015 Physical Exam  Constitutional: She is oriented to person, place, and time. She appears well-developed  and well-nourished. No distress.  Nontoxic appearing.  HENT:  Head: Normocephalic and atraumatic.  Right Ear: External ear normal.  Left Ear: External ear normal.  Mouth/Throat: Oropharynx is clear and moist. No oropharyngeal exudate.  Eyes: Conjunctivae are normal. Pupils are equal, round, and reactive to light. Right eye exhibits no discharge. Left eye exhibits no discharge.  Neck: Normal range of motion. Neck supple.  Cardiovascular: Normal rate, regular rhythm, normal heart sounds and intact distal  pulses.   Pulmonary/Chest: Effort normal and breath sounds normal. No respiratory distress. She has no wheezes. She has no rales.  Abdominal: Soft. There is no tenderness.  Musculoskeletal: She exhibits no edema.  Lymphadenopathy:    She has no cervical adenopathy.  Neurological: She is alert and oriented to person, place, and time. Coordination normal.  Skin: Skin is warm and dry. No rash noted. She is not diaphoretic. There is erythema. No pallor.  What resembles several mosquito bites to her right upper arm with 1 cm areas of induration with overlying erythema. No surrounding erythema or discharge. No evidence of cellulitis. Non-tender to touch. No vesicles.   Psychiatric: She has a normal mood and affect. Her behavior is normal.  Nursing note and vitals reviewed.   ED Course  Procedures (including critical care time)  DIAGNOSTIC STUDIES: Oxygen Saturation is 100% on RA, normal by my interpretation.    COORDINATION OF CARE: 10:32 AM - Discussed treatment plan with pt at bedside which includes Rx hydrocortisone 2.5% cream and continuing to use Zyrtec. Cautioned pt not to apply hydrocortisone to her face. Recommended pt to have her boyfriend check his dog for fleas and possibly have her home evaluated for bedbugs. Advised to use bug spray when outside.  Strict return precautions given. Instructed [facility] for [facility]-up appointment and to be established for primary care. Pt verbalized understanding and agreed to plan.   MDM   Meds given in ED:  Medications - No data to display  Discharge Medication List as of 05/10/2015 10:36 AM    START taking these medications   Details  hydrocortisone 2.5 % lotion Apply topically 2 (two) times daily., Starting 05/10/2015, Until Discontinued, Print        Final diagnoses:  Rash and nonspecific skin eruption   This is a 29 y.o. female who presents to the Emergency Department complaining of pruritic, small bumps on her right arm  that resemble insect bites. She first noticed these yesterday, but she was also seen and evaluated about 2 weeks ago, on 04/23/15, for similar symptoms on her ankles, thighs, buttocks, and back. She originally thought she was having an allergic reaction, but she was told she may have mosquito bites. Patient has taken Zyrtec with minimal relief.  On exam the patient is afebrile and non-toxic appearing. She what resembles several mosquito bites to her right upper arm with 1 cm areas of induration with overlying erythema. No surrounding erythema or discharge. No evidence of cellulitis. Non-tender to touch. No vesicles. No blisters, no pustules, no warmth, no draining sinus tracts, no superficial abscesses, no bullous impetigo, no vesicles, no desquamation, no target lesions with dusky purpura or a central bulla. Will discharge with rx for hydrocortisone 2.5% lotion and have her continue with zyrtec. I advised the patient to follow-up with their primary care provider this week. I advised the patient to return to the emergency department with new or worsening symptoms or new concerns. The patient verbalized understanding and agreement with plan.  I personally performed the services described in this documentation, which was scribed in my presence. The recorded information has been reviewed and is accurate.       Everlene Farrier, PA-C 05/10/15 1145  Pricilla Loveless, MD 05/13/15 1630

## 2015-05-10 NOTE — ED Notes (Signed)
PT presents with increased redness to insect bite areas.

## 2015-08-30 ENCOUNTER — Encounter (HOSPITAL_COMMUNITY): Payer: Self-pay | Admitting: Emergency Medicine

## 2015-08-30 ENCOUNTER — Emergency Department (HOSPITAL_COMMUNITY)
Admission: EM | Admit: 2015-08-30 | Discharge: 2015-08-30 | Disposition: A | Discharge status: Home / Self Care | Payer: Self-pay | Attending: Emergency Medicine | Admitting: Emergency Medicine

## 2015-08-30 DIAGNOSIS — Z79899 Other long term (current) drug therapy: Secondary | ICD-10-CM | POA: Insufficient documentation

## 2015-08-30 DIAGNOSIS — F17210 Nicotine dependence, cigarettes, uncomplicated: Secondary | ICD-10-CM | POA: Insufficient documentation

## 2015-08-30 DIAGNOSIS — M7981 Nontraumatic hematoma of soft tissue: Secondary | ICD-10-CM | POA: Insufficient documentation

## 2015-08-30 DIAGNOSIS — E669 Obesity, unspecified: Secondary | ICD-10-CM | POA: Insufficient documentation

## 2015-08-30 DIAGNOSIS — Z792 Long term (current) use of antibiotics: Secondary | ICD-10-CM | POA: Insufficient documentation

## 2015-08-30 DIAGNOSIS — R233 Spontaneous ecchymoses: Secondary | ICD-10-CM

## 2015-08-30 DIAGNOSIS — R238 Other skin changes: Secondary | ICD-10-CM

## 2015-08-30 DIAGNOSIS — Z7952 Long term (current) use of systemic steroids: Secondary | ICD-10-CM | POA: Insufficient documentation

## 2015-08-30 LAB — CBC WITH DIFFERENTIAL/PLATELET
BASOS PCT: 1 %
Basophils Absolute: 0.1 10*3/uL (ref 0.0–0.1)
EOS ABS: 0.1 10*3/uL (ref 0.0–0.7)
EOS PCT: 2 %
HCT: 37.7 % (ref 36.0–46.0)
HEMOGLOBIN: 12.4 g/dL (ref 12.0–15.0)
LYMPHS PCT: 36 %
Lymphs Abs: 1.9 10*3/uL (ref 0.7–4.0)
MCH: 23.1 pg — AB (ref 26.0–34.0)
MCHC: 32.9 g/dL (ref 30.0–36.0)
MCV: 70.2 fL — ABNORMAL LOW (ref 78.0–100.0)
Monocytes Absolute: 0.3 10*3/uL (ref 0.1–1.0)
Monocytes Relative: 5 %
NEUTROS PCT: 56 %
Neutro Abs: 2.9 10*3/uL (ref 1.7–7.7)
Platelets: 254 10*3/uL (ref 150–400)
RBC: 5.37 MIL/uL — ABNORMAL HIGH (ref 3.87–5.11)
RDW: 14.7 % (ref 11.5–15.5)
WBC: 5.3 10*3/uL (ref 4.0–10.5)

## 2015-08-30 LAB — PROTIME-INR
INR: 1.08 (ref 0.00–1.49)
PROTHROMBIN TIME: 14.2 s (ref 11.6–15.2)

## 2015-08-30 LAB — APTT: APTT: 30 s (ref 24–37)

## 2015-08-30 NOTE — ED Notes (Signed)
Pt ambulates independently at time of discharge. Discharge instructions and follow up information reviewed with pt. No additional questions or concerns at this time.

## 2015-08-30 NOTE — ED Notes (Signed)
Pt reports that she started noticing bruising on her arms and legs from an unknown cause and wants to find out why. NAD at this time. Pt alert x4.

## 2015-08-30 NOTE — ED Provider Notes (Signed)
CSN: [id number]     Arrival date & time 08/30/15  1556 History   First MD Initiated Contact with Patient 08/30/15 1713     Chief Complaint  Patient presents with  . Bleeding/Bruising     (Consider location/radiation/quality/duration/timing/severity/associated sxs/prior Treatment) The history is provided by the patient. No language interpreter was used.   Linda Weber is a 30 y.o. morbidly obese female who presents to the ED for bruising of her arms and legs. She states that she does not know why she is bruising and came to try and find out.   Past Medical History  Diagnosis Date  . Abnormal Pap smear   . Obesity    History reviewed. No pertinent past surgical history. Family History  Problem Relation Age of Onset  . Diabetes Mother   . Hypertension Mother   . Breast cancer Maternal Grandmother    Social History  Substance Use Topics  . Smoking status: Current Every Day Smoker -- 1.00 packs/day for 10 years    Types: Cigarettes    Start date: 07/25/2003  . Smokeless tobacco: Never Used  . Alcohol Use: Yes     Comment: occasionally   OB History    Gravida Para Term Preterm AB TAB SAB Ectopic Multiple Living   0         0     Review of Systems Negative except as stated in HPI   Allergies  Benadryl  Home Medications   Prior to Admission medications   Medication Sig Start Date End Date Taking? Authorizing Provider  hydrocortisone 2.5 % lotion Apply topically 2 (two) times daily. 05/10/15   Everlene FarrierWilliam Dansie, PA-C  loratadine (CLARITIN) 10 MG tablet Take 1 tablet (10 mg total) by mouth daily. 04/23/15   Tiffany Neva SeatGreene, PA-C  metroNIDAZOLE (FLAGYL) 500 MG tablet Take 1 tablet (500 mg total) by mouth 2 (two) times daily. 12/02/14   [name] Constant, MD   BP 132/77 mmHg  Pulse 63  Temp(Src) 98.6 F (37 C) (Oral)  Resp 18  SpO2 100%  LMP 08/22/2015 (Exact Date) Physical Exam  Constitutional: She is oriented to person, place, and time. She appears well-developed and  well-nourished.  HENT:  Head: Normocephalic and atraumatic.  Eyes: Conjunctivae and EOM are normal.  Neck: Normal range of motion. Neck supple.  Cardiovascular: Normal rate and regular rhythm.   Pulmonary/Chest: Effort normal and breath sounds normal.  Abdominal: Soft. There is no tenderness.  Musculoskeletal: Normal range of motion.  Ecchymosis noted to right forearm approximately 2 cm. One area appears old the other area is tender on exam.  One 1.5 cm area to the left thigh noted.   Neurological: She is alert and oriented to person, place, and time. No cranial nerve deficit.  Skin: Skin is warm and dry.  Psychiatric: She has a normal mood and affect. Her behavior is normal.  Nursing note and vitals reviewed.   ED Course  Procedures (including critical care time) Labs Review Results for orders placed or performed during the hospital encounter of 08/30/15 (from the past 24 hour(s))  CBC with Differential     Status: Abnormal   Collection Time: 08/30/15  5:19 PM  Result Value Ref Range   WBC 5.3 4.0 - 10.5 K/uL   RBC 5.37 (H) 3.87 - 5.11 MIL/uL   Hemoglobin 12.4 12.0 - 15.0 g/dL   HCT 44.037.7 10.236.0 - 72.546.0 %   MCV 70.2 (L) 78.0 - 100.0 fL   MCH 23.1 (L) 26.0 - 34.0  pg   MCHC 32.9 30.0 - 36.0 g/dL   RDW 13.0 86.5 - 78.4 %   Platelets 254 150 - 400 K/uL   Neutrophils Relative % 56 %   Lymphocytes Relative 36 %   Monocytes Relative 5 %   Eosinophils Relative 2 %   Basophils Relative 1 %   Neutro Abs 2.9 1.7 - 7.7 K/uL   Lymphs Abs 1.9 0.7 - 4.0 K/uL   Monocytes Absolute 0.3 0.1 - 1.0 K/uL   Eosinophils Absolute 0.1 0.0 - 0.7 K/uL   Basophils Absolute 0.1 0.0 - 0.1 K/uL   WBC Morphology ATYPICAL LYMPHOCYTES   Protime-INR     Status: None   Collection Time: 08/30/15  5:19 PM  Result Value Ref Range   Prothrombin Time 14.2 11.6 - 15.2 seconds   INR 1.08 0.00 - 1.49  APTT     Status: None   Collection Time: 08/30/15  5:19 PM  Result Value Ref Range   aPTT 30 24 - 37 seconds      I discussed this case with Dr. Roselyn Bering  MDM  30 y.o. female with concern for bruising more than in the past without known injury stable for d/c with normal labs and no other symptoms. I discussed results with the patient and need to establish care with a PCP. Patient voices understanding and agrees with plan. She will return for any problems.  Final diagnoses:  Easy bruising      [facility], NP 08/31/15 1312  Linwood Dibbles, MD 09/02/15 [phone]

## 2015-08-30 NOTE — Discharge Instructions (Signed)
Your blood work today is normal. [facility] with [facility] for continued evaluation and health care.  Return here as needed.

## 2015-09-02 LAB — PATHOLOGIST SMEAR REVIEW

## 2015-09-16 ENCOUNTER — Emergency Department (HOSPITAL_COMMUNITY)
Admission: EM | Admit: 2015-09-16 | Discharge: 2015-09-16 | Disposition: A | Discharge status: Home / Self Care | Payer: Self-pay | Attending: Emergency Medicine | Admitting: Emergency Medicine

## 2015-09-16 ENCOUNTER — Encounter (HOSPITAL_COMMUNITY): Payer: Self-pay | Admitting: Emergency Medicine

## 2015-09-16 DIAGNOSIS — E669 Obesity, unspecified: Secondary | ICD-10-CM | POA: Insufficient documentation

## 2015-09-16 DIAGNOSIS — K0889 Other specified disorders of teeth and supporting structures: Secondary | ICD-10-CM | POA: Insufficient documentation

## 2015-09-16 DIAGNOSIS — F17210 Nicotine dependence, cigarettes, uncomplicated: Secondary | ICD-10-CM | POA: Insufficient documentation

## 2015-09-16 DIAGNOSIS — K029 Dental caries, unspecified: Secondary | ICD-10-CM | POA: Insufficient documentation

## 2015-09-16 MED ORDER — PENICILLIN V POTASSIUM 500 MG PO TABS: 500.0000 mg | ORAL_TABLET | Freq: Four times a day (QID) | ORAL | Status: AC

## 2015-09-16 MED ORDER — IBUPROFEN 800 MG PO TABS
800.0000 mg | ORAL_TABLET | Freq: Once | ORAL | Status: AC
  Administered 2015-09-16: 800 mg via ORAL
  Filled 2015-09-16: qty 1

## 2015-09-16 MED ORDER — IBUPROFEN 800 MG PO TABS: 800.0000 mg | ORAL_TABLET | Freq: Three times a day (TID) | ORAL | Status: DC

## 2015-09-16 NOTE — Discharge Instructions (Signed)
Dental Pain  ° ° °Dental pain may be caused by many things, including:  °Tooth decay (cavities or caries). Cavities expose the nerve of your tooth to air and hot or cold temperatures. This can cause pain or discomfort.  °Abscess or infection. A dental abscess is a collection of infected pus from a bacterial infection in the inner part of the tooth (pulp). It usually occurs at the end of the tooth's root.  °Injury.  °An unknown reason (idiopathic). °Your pain may be mild or severe. It may only occur when:  °You are chewing.  °You are exposed to hot or cold temperature.  °You are eating or drinking sugary foods or beverages, such as soda or candy. °Your pain may also be constant.  °HOME CARE INSTRUCTIONS  °Watch your dental pain for any changes. The following actions may help to lessen any discomfort that you are feeling:  °Take medicines only as directed by your dentist.  °If you were prescribed an antibiotic medicine, finish all of it even if you start to feel better.  °Keep all follow-up visits as directed by your dentist. This is important.  °Do not apply heat to the outside of your face.  °Rinse your mouth or gargle with salt water if directed by your dentist. This helps with pain and swelling.  °You can make salt water by adding ¼ tsp of salt to 1 cup of warm water. °Apply ice to the painful area of your face:  °Put ice in a plastic bag.  °Place a towel between your skin and the bag.  °Leave the ice on for 20 minutes, 2-3 times per day. °Avoid foods or drinks that cause you pain, such as:  °Very hot or very cold foods or drinks.  °Sweet or sugary foods or drinks. °SEEK MEDICAL CARE IF:  °Your pain is not controlled with medicines.  °Your symptoms are worse.  °You have new symptoms. °SEEK IMMEDIATE MEDICAL CARE IF:  °You are unable to open your mouth.  °You are having trouble breathing or swallowing.  °You have a fever.  °Your face, neck, or jaw is swollen. °This information is not intended to replace advice  given to you by your health care provider. Make sure you discuss any questions you have with your health care provider.  °Document Released: 08/02/2005 Document Revised: 12/17/2014 Document Reviewed: 07/29/2014  °Elsevier Interactive Patient Education ©2016 Elsevier Inc.  ° ° °Emergency Department Resource Guide °1) Find a Doctor and Pay Out of Pocket °Although you won't have to find out who is covered by your insurance plan, it is a good idea to ask around and get recommendations. You will then need to call the office and see if the doctor you have chosen will accept you as a new patient and what types of options they offer for patients who are self-pay. Some doctors offer discounts or will set up payment plans for their patients who do not have insurance, but you will need to ask so you aren't surprised when you get to your appointment. ° °2) Contact Your Local Health Department °Not all health departments have doctors that can see patients for sick visits, but many do, so it is worth a call to see if yours does. If you don't know where your local health department is, you can check in your phone book. The CDC also has a tool to help you locate your state's health department, and many state websites also have listings of all of their local health departments. ° °  3) Find a Walk-in Clinic °If your illness is not likely to be very severe or complicated, you may want to try a walk in clinic. These are popping up all over the country in pharmacies, drugstores, and shopping centers. They're usually staffed by nurse practitioners or physician assistants that have been trained to treat common illnesses and complaints. They're usually fairly quick and inexpensive. However, if you have serious medical issues or chronic medical problems, these are probably not your best option. ° °No Primary Care Doctor: °- Call [facility] at  832-8000 - they can help you locate a primary care doctor that  accepts your insurance,  provides certain services, etc. °- Physician Referral Service- 1-800-533-3463 ° °Chronic Pain Problems: °Organization         Address  Phone   Notes  °[facility]  (336) 297-2271 Patients need to be referred by their primary care doctor.  ° °Medication Assistance: °Organization         Address  Phone   Notes  °[place] [facility] [address] °[place], [place] [address] (336) 641-8030 --Must be a resident of [place] °-- Must have NO insurance coverage whatsoever (no Medicaid/ Medicare, etc.) °-- The pt. MUST have a primary care doctor that directs their care regularly and follows them in the community °  °[facility]  (866) 331-1348   °[employer]  (888) 892-1162   ° °Agencies that provide inexpensive medical care: °Organization         Address  Phone   Notes  °[facility]  (336) 832-8035   °[facility]    (336) 832-7272   °[facility] [address] °[place], [place] [address] (336) 832-4777   °[facility] [address], °[place] (336) 271-4999   °[facility]    (336) 373-0678   °[facility]    (336) 272-1050   °[facility] ° [address], [place] Phone:  (336) 832-4444, Fax:  (336) 832-4440 Hours of Operation:  9 am - 6 pm, M-F.  Also accepts Medicaid/Medicare and self-pay.  °[facility] ° [address], [place] Phone: (336) 832-3150, Fax: (336) 832-3151. Hours of Operation:  8:30 am - 5:30 pm, M-F.  Also accepts Medicaid and self-pay.  °[facility] [place] [address], [place] Phone: (336) 878-6027   °[facility] [address], [place], [place] (336)723-1848, Ext. 123 Mondays & Thursdays: 7-9 AM.  First 15 patients are seen on a first come, first serve basis. °  ° °Medicaid-accepting [place] Providers: ° °Organization         Address  Phone    Notes  °[facility] [address], [place] (336) 641-2100 Also accepts self-pay patients.  °[facility] [address], [place] ° (336) 856-9996   °[facility] [address], [place] (336) 288-8857   °[facility] [address] [place] [address], [place] (336) 299-7000   °Veita Bland [address],   ° (336) 373-1557 Only accepts [place] Access Medicaid patients after they have their name applied to their card.  ° °Self-Pay (no insurance) in [place]: ° °Organization         Address  Phone   Notes  °Sickle Cell Patients, [facility] [address], [place] (336) 832-1970   °[facility] [address], [place] (336) 832-4400   °[facility] ° [address], [place] (336) 992-4800   °[facility]/Dr. Osei-Bonsu ° [address] [place] [address], [place] or [address], [place] (336) 841-8500 Phone number for both [place] and [place] locations is the same.  °[facility] [address], [place] (336) 299-0000   °[facility] [address] [place] [address], [place] or [address] (336) 852-7530 °(336) 878-2260   °[facility] [address], [place] (336) 350-1642, phone; (336) 294-5005, fax Sees patients 1st and 3rd Saturday of every month.  Must not qualify for public or private insurance (i.e. Medicaid, Medicare, [place] Health Choice, Veterans' Benefits) • Household income should be no more than 200% of the poverty level •The clinic cannot treat you if you are pregnant or think you are pregnant • Sexually transmitted diseases are not treated at the clinic.  ° ° °Dental Care: °Organization         Address  Phone  Notes  °[facility] [facility] [address], [place] (336)  641-6152 Accepts children up to age 21 who are enrolled in Medicaid or [place] Health Choice; pregnant women with a Medicaid card; and children who have applied for Medicaid or [place] Health Choice, but were declined, whose parents can pay a reduced fee at time of service.  °[facility] [place]  [address], [place] (336) 641-7733 Accepts children up to age 21 who are enrolled in Medicaid or [place] Health Choice; pregnant women with a Medicaid card; and children who have applied for Medicaid or [place] Health Choice, but were declined, whose parents can pay a reduced fee at time of service.  °[facility] ° [address], [place] (336) 641-4533 Patients are seen by appointment only. Walk-ins are not accepted. [facility] will see patients 18 years of age and older. °Monday - Tuesday (8am-5pm) °Most Wednesdays (8:30-5pm) °$30 per visit, cash only  °[facility] ° [address], [place] (336) 641-4533 Patients are seen by appointment only. Walk-ins are not accepted. [facility] will see patients 18 years of age and older. °One Wednesday Evening (Monthly: Volunteer Based).  $30 per visit, cash only  °[facility]  (919) 537-3737 for adults; Children under age 4, call Graduate Pediatric Dentistry at (919) [phone]. Children aged 4-14, please call (919) [phone] to request a pediatric application. ° Dental services are provided in all areas of dental care including fillings, crowns and bridges, complete and partial dentures, implants, gum treatment, root canals, and extractions. Preventive care is also provided. Treatment is provided to both adults and children. °Patients are selected via a lottery and there is often a waiting list. °  °[facility] [address], °[place] ° (336) 763-8833 [email or web address] °  °[facility] [address], [place], [place] (336)723-1848, Ext.  123 Second and Fourth Thursday of each month, opens at 6:30 AM; Clinic ends at 9 AM.  Patients are seen on a first-come first-served basis, and a limited number are seen during each clinic.  ° °[facility] ° [address], [place],  (336) 723-7904   Eligibility Requirements °You must have lived in [place],   [place], or [place] counties for at least the last three months. °  You cannot be eligible for state or federal sponsored healthcare insurance, including Veterans Administration, Medicaid, or Medicare. °  You generally cannot be eligible for healthcare insurance through your employer.  °  How to apply: °Eligibility screenings are held every Tuesday and Wednesday afternoon from 1:00 pm until 4:00 pm. You do not need an appointment for the interview!  °[facility] [address], [place], [place] 336-631-2330   °[facility]  336-342-8273   °[facility]  336-703-3100   °[facility]  336-570-6415   ° °Behavioral Health Resources in the Community: °Intensive Outpatient Programs °Organization         Address  Phone  Notes  °[facility] [address], [place], [place] 336-878-6098   °[facility] [address], [place], [place] 336-832-9800   °[facility] [address], [place], [place] ° 336-882-2125   °[facility] [address],  °[place], [place] 1-800-853-5163 or 336-641-4981   °Substance Abuse Resources °Organization         Address  Phone  Notes  °[facility]  336-882-2125   °[facility]  336-784-9470   °[facility] [place] [facility]  336-285-9073   °[facility]  336-845-3988   °Residential & Outpatient Substance Abuse Program  1-800-659-3381   °Psychological Services °Organization         Address  Phone  Notes  °[facility]  336- 832-9600   °[facility]  336- 378-7881   °[place] [address], [place] 1-800-853-5163 or 336-641-4981   ° °Mobile Crisis Teams °Organization         Address  Phone  Notes  °[facility], Mobile Crisis Care Unit  1-877-626-1772   °Assertive °[facility] ° [address] [place], [place] 336-834-9664   °Sharon DeEsch [address] °[place] 336-554-5454   ° °Self-Help/Support Groups °Organization         Address  Phone             Notes  °[facility] - variety of support groups  336- 373-1402 Call for more information  °Narcotics Anonymous (NA), [facility] [address], °[place]  2 meetings at this location  ° °Residential Treatment Programs °Organization         Address  Phone  Notes  °[facility] [address],    °[place]  1-866-801-8205   °[facility] ° [address], [place], [place] 704-293-8524   °[facility] [address], [place] 336-845-3988 Admissions: 8am-3pm M-F  °[facility] [address]    °[place], [place] 336-841-1104   °[facility] [address], [place], [place] 336-379-7146   °[facility] [place] [facility] [address]  °[place], [place] 336-285-9073   °[facility] - Intensive Outpatient [address], [place], [place] 336-852-3033   °[facility] [address]  °[place], [place] 1-877-615-2722 or 336-784-9470   °[facility] [address] [place], [place] 336-227-7417 Accepts Medicaid  °[facility] [address]  °[place]  1-800-659-3381 Substance Abuse/Addiction Treatment  ° °[place] Behavioral Health Resources °Organization         Address  Phone  Notes  °[facility]  (888) 581-9988   °Julie Brannon, PhD [address] [place], [place]   (336) 349-5553 or (336) 951-0000   °[facility]   [address] °[place], [place] (336) 349-4454   °[facility] [address], [place], [place] (336) 342-8316 Insurance/Medicaid/sponsorship through [facility]  °[facility] [address]                                    [place], [place] (336) 342-8316 Therapy/tele-psych/case  °[facility] [address]  ° [place], [place] (336) 349-2233    °Dr. Arfeen  (336) 349-4544   °[facility] [place]  [employer] [place] [facility] 1) [address], [place] °2) [address], [place] °3)  [address], [place] (336) 349-3220 °(336) 342-7768 ° °(336) 342-8140   °[place] [facility] (336) 342-1394 or (336) 342-3537 (After Hours)    ° ° ° °

## 2015-09-16 NOTE — ED Provider Notes (Signed)
CSN: [id number]     Arrival date & time 09/16/15  8657 History   First MD Initiated Contact with Patient 09/16/15 0715     Chief Complaint  Patient presents with  . Dental Pain     (Consider location/radiation/quality/duration/timing/severity/associated sxs/prior Treatment) Patient is a 30 y.o. female presenting with tooth pain. The history is provided by the patient.  Dental Pain Toothache location: right upper and lower. Quality:  Aching and pressure-like Severity:  Moderate Onset quality:  Gradual Duration:  1 week Timing:  Constant Progression:  Unchanged Chronicity:  New Context: dental caries, dental fracture (cracked tooth on Christmas Eve, about 1 month ago) and poor dentition   Context: not abscess   Relieved by:  Acetaminophen and ice Worsened by:  Pressure Ineffective treatments:  None tried Associated symptoms: no difficulty swallowing, no drooling, no facial swelling, no fever, no neck pain, no neck swelling and no trismus   Risk factors: smoking        Past Medical History  Diagnosis Date  . Abnormal Pap smear   . Obesity    History reviewed. No pertinent past surgical history. Family History  Problem Relation Age of Onset  . Diabetes Mother   . Hypertension Mother   . Breast cancer Maternal Grandmother    Social History  Substance Use Topics  . Smoking status: Current Every Day Smoker -- 1.00 packs/day for 10 years    Types: Cigarettes    Start date: 07/25/2003  . Smokeless tobacco: Never Used  . Alcohol Use: Yes     Comment: occasionally   OB History    Gravida Para Term Preterm AB TAB SAB Ectopic Multiple Living   0         0     Review of Systems  Constitutional: Negative for fever and chills.  HENT: Positive for dental problem. Negative for drooling, ear discharge, facial swelling and trouble swallowing.   Musculoskeletal: Negative for neck pain.  All other systems reviewed and are negative.     Allergies  Benadryl  Home  Medications   Prior to Admission medications   Medication Sig Start Date End Date Taking? Authorizing Provider  acetaminophen (TYLENOL) 325 MG tablet Take 650 mg by mouth every 6 (six) hours as needed for mild pain.   Yes Historical Provider, MD  hydrocortisone 2.5 % lotion Apply topically 2 (two) times daily. Patient not taking: Reported on 09/16/2015 05/10/15   Everlene Farrier, PA-C  ibuprofen (ADVIL,MOTRIN) 800 MG tablet Take 1 tablet (800 mg total) by mouth 3 (three) times daily. 09/16/15   Cheri Fowler, PA-C  loratadine (CLARITIN) 10 MG tablet Take 1 tablet (10 mg total) by mouth daily. Patient not taking: Reported on 09/16/2015 04/23/15   Marlon Pel, PA-C  metroNIDAZOLE (FLAGYL) 500 MG tablet Take 1 tablet (500 mg total) by mouth 2 (two) times daily. Patient not taking: Reported on 09/16/2015 12/02/14   Catalina Antigua, MD  penicillin v potassium (VEETID) 500 MG tablet Take 1 tablet (500 mg total) by mouth 4 (four) times daily. 09/16/15 09/23/15  Andie Mortimer, PA-C   BP 141/94 mmHg  Pulse 91  Temp(Src) 98.5 F (36.9 C) (Oral)  Resp 16  SpO2 100%  LMP 08/20/2015 Physical Exam  Constitutional: She is oriented to person, place, and time. She appears well-developed and well-nourished.  HENT:  Head: Normocephalic and atraumatic.  Mouth/Throat: Uvula is midline, oropharynx is clear and moist and mucous membranes are normal. No oral lesions. No trismus in the jaw. Abnormal dentition.  Dental caries present. No dental abscesses.  No tongue swelling or facial swelling.  Airway patent. Patient tolerating secretions without difficulty. Evidence of poor dentition.  Right upper molar cracked and slightly decayed.  No visualization of dental abscess or drainage.    Eyes: Conjunctivae are normal.  Neck: Normal range of motion. Neck supple.  No evidence of Ludwigs angina.  Cardiovascular: Normal rate, regular rhythm and normal heart sounds.   Pulmonary/Chest: Effort normal and breath sounds normal.   Abdominal: Bowel sounds are normal. She exhibits no distension.  Musculoskeletal:  Moves all extremities spontaneously.  Lymphadenopathy:    She has no cervical adenopathy.  Neurological: She is alert and oriented to person, place, and time.  Speech clear without dysarthria.   Skin: Skin is warm and dry.    ED Course  Procedures (including critical care time) Labs Review Labs Reviewed - No data to display  Imaging Review No results found. I have personally reviewed and evaluated these images and lab results as part of my medical decision-making.   EKG Interpretation None      MDM   Final diagnoses:  Pain, dental    Suspect dental pain associated with dental infection and possible dental abscess with patient afebrile, non toxic appearing and swallowing secretions well. I gave patient referral to dentist and stressed the importance of dental follow up for ultimate management of dental pain. Discussed return precautions.  Patient expresses understanding and agrees with plan.  I will also give penicillin VK and pain control.      Cheri Fowler, PA-C 09/16/15 0454  Tomasita Crumble, MD 09/16/15 [id number]

## 2015-09-16 NOTE — ED Notes (Signed)
Pt reports dental pain ongoing one week, thinks she's got an abscess. Pain ongoing x1 week. Tylenol pta, not effective.

## 2017-06-27 ENCOUNTER — Encounter (HOSPITAL_COMMUNITY): Payer: Self-pay

## 2017-11-24 ENCOUNTER — Encounter (HOSPITAL_COMMUNITY): Payer: Self-pay | Admitting: *Deleted

## 2017-11-24 ENCOUNTER — Emergency Department (HOSPITAL_COMMUNITY)
Admission: EM | Admit: 2017-11-24 | Discharge: 2017-11-25 | Disposition: A | Discharge status: Home / Self Care | Payer: Self-pay | Attending: Emergency Medicine | Admitting: Emergency Medicine

## 2017-11-24 ENCOUNTER — Other Ambulatory Visit: Payer: Self-pay

## 2017-11-24 DIAGNOSIS — R0789 Other chest pain: Secondary | ICD-10-CM

## 2017-11-24 DIAGNOSIS — M546 Pain in thoracic spine: Secondary | ICD-10-CM

## 2017-11-24 DIAGNOSIS — F17210 Nicotine dependence, cigarettes, uncomplicated: Secondary | ICD-10-CM | POA: Insufficient documentation

## 2017-11-24 DIAGNOSIS — Z79899 Other long term (current) drug therapy: Secondary | ICD-10-CM | POA: Insufficient documentation

## 2017-11-24 LAB — I-STAT BETA HCG BLOOD, ED (MC, WL, AP ONLY)

## 2017-11-24 LAB — CBC
HCT: 36.6 % (ref 36.0–46.0)
HEMOGLOBIN: 11.9 g/dL — AB (ref 12.0–15.0)
MCH: 23.1 pg — AB (ref 26.0–34.0)
MCHC: 32.5 g/dL (ref 30.0–36.0)
MCV: 70.9 fL — AB (ref 78.0–100.0)
Platelets: 236 10*3/uL (ref 150–400)
RBC: 5.16 MIL/uL — AB (ref 3.87–5.11)
RDW: 15.1 % (ref 11.5–15.5)
WBC: 7 10*3/uL (ref 4.0–10.5)

## 2017-11-24 LAB — I-STAT TROPONIN, ED: Troponin i, poc: 0 ng/mL (ref 0.00–0.08)

## 2017-11-24 LAB — D-DIMER, QUANTITATIVE: D-Dimer, Quant: <0.27 ug/mL-FEU (ref 0.00–0.50)

## 2017-11-24 MED ORDER — KETOROLAC TROMETHAMINE 30 MG/ML IJ SOLN
30.0000 mg | Freq: Once | INTRAMUSCULAR | Status: AC
  Administered 2017-11-24: 30 mg via INTRAVENOUS
  Filled 2017-11-24: qty 1

## 2017-11-24 MED ORDER — SODIUM CHLORIDE 0.9 % IV BOLUS: 1000.0000 mL | Freq: Once | INTRAVENOUS | Status: DC

## 2017-11-24 NOTE — ED Triage Notes (Signed)
Pt is here for mid back pain.  Pt reports that pain increases depending on movement.  Pt denies any injury or fall, states that pain has increased over the course of the day.  No distal neuralgia with this.

## 2017-11-24 NOTE — ED Provider Notes (Signed)
[facility] EMERGENCY DEPARTMENT Provider Note   CSN: [id number] Arrival date & time: 11/24/17  2047     History   Chief Complaint Chief Complaint  Patient presents with  . Back Pain    HPI Linda Weber is a 32 y.o. female.  Linda Weber is a 32 y.o. Female with history of obesity, presents to the ED for evaluation of midthoracic back pain.  Patient reports pain started this morning, no inciting injury or heavy lifting.  Patient reports pain is made worse with movement or deep breathing and radiates around to the front of her chest.  Vision also reports some radiation of pain from her mid back up towards her neck.  Patient has not taken anything for pain prior to arrival.  She denies previous history of similar pain.  She denies any cough, no fevers or chills.  Patient denies abdominal pain, nausea or vomiting.  Patient denies any recent surgeries or long distance travel, she is not on any estrogen therapy, she reports some occasional swelling in her left lower leg when she is up walking frequently.  No pain or swelling there today.  Patient does denies personal history of PE or DVT but does report her mom was recently hospitalized and treated for large PE.  Patient denies sensation of tearing or ripping pain, no numbness, tingling or weakness in any of her extremities.     Past Medical History:  Diagnosis Date  . Abnormal Pap smear   . Obesity     Patient Active Problem List   Diagnosis Date Noted  . Mood and affect disturbance 07/24/2013  . Obesity, unspecified 07/24/2013  . Nicotine addiction 07/24/2013  . Uses marijuana 07/24/2013  . Oligomenorrhea 12/22/2011    History reviewed. No pertinent surgical history.   OB History    Gravida  0   Para      Term      Preterm      AB      Living  0     SAB      TAB      Ectopic      Multiple      Live Births               Home Medications    Prior to Admission medications     Medication Sig Start Date End Date Taking? Authorizing Provider  acetaminophen (TYLENOL) 325 MG tablet Take 650 mg by mouth every 6 (six) hours as needed for mild pain.    [provider]  hydrocortisone 2.5 % lotion Apply topically 2 (two) times daily. Patient not taking: Reported on 09/16/2015 05/10/15   Everlene Farrier, PA-C  ibuprofen (ADVIL,MOTRIN) 800 MG tablet Take 1 tablet (800 mg total) by mouth 3 (three) times daily. 09/16/15   Cheri Fowler, PA-C  loratadine (CLARITIN) 10 MG tablet Take 1 tablet (10 mg total) by mouth daily. Patient not taking: Reported on 09/16/2015 04/23/15   Marlon Pel, PA-C  metroNIDAZOLE (FLAGYL) 500 MG tablet Take 1 tablet (500 mg total) by mouth 2 (two) times daily. Patient not taking: Reported on 09/16/2015 12/02/14   Constant, [name], MD    Family History Family History  Problem Relation Age of Onset  . Diabetes Mother   . Hypertension Mother   . Breast cancer Maternal Grandmother     Social History Social History   Tobacco Use  . Smoking status: Current Every Day Smoker    Packs/day: 1.00  Years: 10.00    Pack years: 10.00    Types: Cigarettes    Start date: 07/25/2003  . Smokeless tobacco: Never Used  Substance Use Topics  . Alcohol use: Yes    Comment: occasionally  . Drug use: No    Types: Marijuana, Cocaine    Comment: Hx cocaine & marijuana use     Allergies   Benadryl [diphenhydramine hcl]   Review of Systems Review of Systems  Constitutional: Negative for chills and fever.  HENT: Negative for congestion, rhinorrhea and sore throat.   Eyes: Negative for visual disturbance.  Respiratory: Negative for chest tightness, shortness of breath and wheezing.   Cardiovascular: Positive for chest pain and leg swelling. Negative for palpitations.  Gastrointestinal: Negative for abdominal pain, diarrhea, nausea and vomiting.  Genitourinary: Negative for dysuria and frequency.  Musculoskeletal: Positive for back pain and neck  pain. Negative for arthralgias, gait problem, myalgias and neck stiffness.  Skin: Negative for color change and rash.  Neurological: Negative for weakness and numbness.     Physical Exam Updated Vital Signs BP (!) 143/89 (BP Location: Right Arm)   Pulse 82   Temp 98.7 F (37.1 C) (Oral)   Resp 18   Wt 129.3 kg (285 lb)   SpO2 100%   BMI 52.13 kg/m   Physical Exam  Constitutional: She appears well-developed and well-nourished. No distress.  HENT:  Head: Normocephalic and atraumatic.  Eyes: Right eye exhibits no discharge. Left eye exhibits no discharge.  Neck: Normal range of motion. Neck supple.  No midline C-spine tenderness normal range of motion in all directions  Cardiovascular: Normal rate, regular rhythm, normal heart sounds and intact distal pulses.  Pulmonary/Chest: Effort normal and breath sounds normal. No stridor. No respiratory distress. She has no wheezes. She has no rales.  Mild diffuse chest tenderness without palpable deformity or crepitus, Respirations equal and unlabored, patient able to speak in full sentences, lungs clear to auscultation bilaterally  Abdominal: Soft. Bowel sounds are normal. She exhibits no distension and no mass. There is no tenderness. There is no guarding.  Abdomen soft, nondistended, nontender to palpation in all quadrants.  Musculoskeletal:  Diffuse tenderness over bilateral areas and midline of thoracic back, no midline lumbar tenderness, no palpable deformity or crepitus, no overlying erythema or rash.  Neurological: She is alert. Coordination normal.  Speech is clear, able to follow commands CN III-XII intact Normal strength in upper and lower extremities bilaterally including dorsiflexion and plantar flexion, strong and equal grip strength Sensation normal to light and sharp touch Moves extremities without ataxia, coordination intact  Skin: Skin is warm and dry. Capillary refill takes less than 2 seconds. She is not diaphoretic.    Psychiatric: She has a normal mood and affect. Her behavior is normal.  Nursing note and vitals reviewed.    ED Treatments / Results  Labs (all labs ordered are listed, but only abnormal results are displayed) Labs Reviewed  BASIC METABOLIC PANEL - Abnormal; Notable for the following components:      Result Value   Potassium 3.4 (*)    All other components within normal limits  CBC - Abnormal; Notable for the following components:   RBC 5.16 (*)    Hemoglobin 11.9 (*)    MCV 70.9 (*)    MCH 23.1 (*)    All other components within normal limits  D-DIMER, QUANTITATIVE (NOT AT [facility])  I-STAT TROPONIN, ED  I-STAT BETA HCG BLOOD, ED ([facility], [facility], [facility] ONLY)  EKG EKG Interpretation  Date/Time:  Thursday November 24 2017 23:21:58 EDT Ventricular Rate:  71 PR Interval:    QRS Duration: 87 QT Interval:  406 QTC Calculation: 442 R Axis:   59 Text Interpretation:  Sinus rhythm Normal ECG Confirmed by Geoffery Lyons ([id number]) on 11/25/2017 12:14:04 AM   Radiology Dg Chest 2 View  Result Date: 11/25/2017 CLINICAL DATA:  Chest and thoracic back pain. EXAM: CHEST - 2 VIEW COMPARISON:  Radiographs 01/27/2009 FINDINGS: Low lung volumes.The cardiomediastinal contours are normal. The lungs are clear. Pulmonary vasculature is normal. No consolidation, pleural effusion, or pneumothorax. No acute osseous abnormalities are seen. Trace endplate spurring in the midthoracic spine. IMPRESSION: 1.  No acute pulmonary process. 2. Trace degenerative endplate spurring in the midthoracic spine. Electronically Signed   By: Rubye Oaks M.D.   On: 11/25/2017 00:37    Procedures Procedures (including critical care time)  Medications Ordered in ED Medications  ketorolac (TORADOL) 30 MG/ML injection 30 mg (30 mg Intravenous Given 11/24/17 2324)     Initial Impression / Assessment and Plan / ED Course  I have reviewed the triage vital signs and the nursing notes.  Pertinent labs & imaging results that were  available during my care of the patient were reviewed by me and considered in my medical decision making (see chart for details).  Presents to the ED for evaluation of thoracic back pain with associated pleuritic chest pain that started this morning.  No fevers or chills.  No radiation of pain, pain is not exertional.  Worse with movement and palpation.  On exam patient mildly hypertensive but vitals otherwise normal.  Patient does have family history of blood clots, no personal history no recent surgeries or long distance travel, no estrogen therapy.  Will get labs including d-dimer and troponin as well as EKG and chest x-ray.  I suspect that pain is likely musculoskeletal in nature although there is no inciting injury.  Will give Toradol for pain.  Lab evaluation is overall reassuring, no leukocytosis, hemoglobin is stable when compared to prior, potassium of 3.4, do not feel this warrants treatment, no other electrolyte derangements, normal renal function.  Initial troponin is negative, EKG without concerning ischemic changes.  D-dimer is negative and hCG is negative.  Chest x-ray shows no evidence of active cardiopulmonary disease, there is mild degenerative changes in the thoracic spine which could easily explain some of patient's back pain..  Given reassuring workup, and that pain is reproducible on exam with outpatient and, patient reports improvement in pain with Toradol, no pain is very likely musculoskeletal in nature.  Will treat with NSAIDs and Robaxin.  Patient to follow-up with her primary care doctor.  Per strict return precautions discussed.  Patient expresses understanding and is in agreement with plan.   Final Clinical Impressions(s) / ED Diagnoses   Final diagnoses:  Acute bilateral thoracic back pain  Atypical chest pain    ED Discharge Orders        Ordered    methocarbamol (ROBAXIN) 500 MG tablet  2 times daily     11/25/17 0052    naproxen (NAPROSYN) 500 MG tablet  2 times  daily     11/25/17 0052       Dartha Lodge, PA-C 11/25/17 0053    Tegeler, Canary Brim, MD 11/25/17 0201

## 2017-11-25 ENCOUNTER — Emergency Department (HOSPITAL_COMMUNITY): Payer: Self-pay

## 2017-11-25 LAB — BASIC METABOLIC PANEL
Anion gap: 10 (ref 5–15)
BUN: 11 mg/dL (ref 6–20)
CO2: 24 mmol/L (ref 22–32)
CREATININE: 0.92 mg/dL (ref 0.44–1.00)
Calcium: 9 mg/dL (ref 8.9–10.3)
Chloride: 103 mmol/L (ref 101–111)
GFR calc Af Amer: >60 mL/min (ref >60)
GLUCOSE: 94 mg/dL (ref 65–99)
POTASSIUM: 3.4 mmol/L — AB (ref 3.5–5.1)
Sodium: 137 mmol/L (ref 135–145)

## 2017-11-25 MED ORDER — METHOCARBAMOL 500 MG PO TABS: 500.0000 mg | ORAL_TABLET | Freq: Two times a day (BID) | ORAL | 0 refills | Status: DC

## 2017-11-25 MED ORDER — NAPROXEN 500 MG PO TABS: 500.0000 mg | ORAL_TABLET | Freq: Two times a day (BID) | ORAL | 0 refills | Status: DC

## 2017-11-25 NOTE — Discharge Instructions (Addendum)
Your workup today is very reassuring, labs, EKG and chest x-ray do not show an acute heart or lung problem causing her symptoms.  I feel your back pain is likely due to to muscle strain and degenerative changes in your spine is seen on x-ray.  Please use naproxen and Robaxin for pain as well as ice and heat.  Follow-up closely with your primary care doctor.  Return to the emergency department for significantly worsening pain, numbness and tingling in her arms or legs.  Chest pain or shortness of breath or other new or concerning symptoms.

## 2019-01-28 ENCOUNTER — Encounter (HOSPITAL_COMMUNITY): Payer: Self-pay

## 2019-01-28 ENCOUNTER — Emergency Department (HOSPITAL_COMMUNITY)
Admission: EM | Admit: 2019-01-28 | Discharge: 2019-01-29 | Disposition: A | Discharge status: Home / Self Care | Payer: Self-pay | Attending: Emergency Medicine | Admitting: Emergency Medicine

## 2019-01-28 ENCOUNTER — Emergency Department (HOSPITAL_COMMUNITY): Payer: Self-pay

## 2019-01-28 DIAGNOSIS — K219 Gastro-esophageal reflux disease without esophagitis: Secondary | ICD-10-CM | POA: Insufficient documentation

## 2019-01-28 DIAGNOSIS — F17210 Nicotine dependence, cigarettes, uncomplicated: Secondary | ICD-10-CM | POA: Insufficient documentation

## 2019-01-28 DIAGNOSIS — Z79899 Other long term (current) drug therapy: Secondary | ICD-10-CM | POA: Insufficient documentation

## 2019-01-28 MED ORDER — SODIUM CHLORIDE 0.9% FLUSH: 3.0000 mL | Freq: Once | INTRAVENOUS | Status: DC

## 2019-01-28 NOTE — ED Triage Notes (Signed)
Pt states that she has had heart burn all day and it is causing her to having chest tightness, denies n/v

## 2019-01-29 LAB — BASIC METABOLIC PANEL
Anion gap: 10 (ref 5–15)
BUN: 12 mg/dL (ref 6–20)
CO2: 25 mmol/L (ref 22–32)
Calcium: 8.6 mg/dL — ABNORMAL LOW (ref 8.9–10.3)
Chloride: 103 mmol/L (ref 98–111)
Creatinine, Ser: 1.01 mg/dL — ABNORMAL HIGH (ref 0.44–1.00)
GFR calc Af Amer: >60 mL/min (ref >60)
GFR calc non Af Amer: >60 mL/min (ref >60)
Glucose, Bld: 90 mg/dL (ref 70–99)
Potassium: 3.4 mmol/L — ABNORMAL LOW (ref 3.5–5.1)
Sodium: 138 mmol/L (ref 135–145)

## 2019-01-29 LAB — CBC
HCT: 37.3 % (ref 36.0–46.0)
Hemoglobin: 11.3 g/dL — ABNORMAL LOW (ref 12.0–15.0)
MCH: 22.2 pg — ABNORMAL LOW (ref 26.0–34.0)
MCHC: 30.3 g/dL (ref 30.0–36.0)
MCV: 73.1 fL — ABNORMAL LOW (ref 80.0–100.0)
Platelets: 291 10*3/uL (ref 150–400)
RBC: 5.1 MIL/uL (ref 3.87–5.11)
RDW: 15.2 % (ref 11.5–15.5)
WBC: 7.9 10*3/uL (ref 4.0–10.5)
nRBC: 0 % (ref 0.0–0.2)

## 2019-01-29 LAB — TROPONIN I: Troponin I: <0.03 ng/mL (ref <0.03)

## 2019-01-29 LAB — I-STAT BETA HCG BLOOD, ED (MC, WL, AP ONLY): I-stat hCG, quantitative: <5 m[IU]/mL (ref <5)

## 2019-01-29 MED ORDER — LIDOCAINE VISCOUS HCL 2 % MT SOLN
15.0000 mL | Freq: Once | OROMUCOSAL | Status: AC
  Administered 2019-01-29: 15 mL via ORAL
  Filled 2019-01-29: qty 15

## 2019-01-29 MED ORDER — PANTOPRAZOLE SODIUM 20 MG PO TBEC: 40.0000 mg | DELAYED_RELEASE_TABLET | Freq: Every day | ORAL | 1 refills | Status: DC

## 2019-01-29 MED ORDER — ALUM & MAG HYDROXIDE-SIMETH 200-200-20 MG/5ML PO SUSP
30.0000 mL | Freq: Once | ORAL | Status: AC
  Administered 2019-01-29: 30 mL via ORAL
  Filled 2019-01-29: qty 30

## 2019-01-29 NOTE — ED Notes (Signed)
ED Provider at bedside. 

## 2019-01-29 NOTE — ED Provider Notes (Signed)
[facility] EMERGENCY DEPARTMENT Provider Note   CSN: [id number] Arrival date & time: 01/28/19  2248    History   Chief Complaint Chief Complaint  Patient presents with  . Chest Pain    HPI Linda Weber is a 33 y.o. female.  HPI: A 33 year old patient with a history of obesity presents for evaluation of chest pain. Initial onset of pain was more than 6 hours ago. The patient's chest pain is not worse with exertion. The patient's chest pain is middle- or left-sided, is not well-localized, is not described as heaviness/pressure/tightness, is not sharp and does not radiate to the arms/jaw/neck. The patient does not complain of nausea and denies diaphoresis. The patient has smoked in the past 90 days. The patient has no history of stroke, has no history of peripheral artery disease, denies any history of treated diabetes, has no relevant family history of coronary artery disease (first degree relative at less than age 18), is not hypertensive and has no history of hypercholesterolemia.   33 year old female presents to the emergency department for evaluation of central chest discomfort.  She describes an aching discomfort that has been improving with belching.  Symptoms fairly constant throughout the day.  Unrelieved with Tums and Pepto-Bismol.  Patient reporting associated chest tightness.  No fevers, syncope, nausea, vomiting, shortness of breath.  She is a 1 pack/day smoker.  Denies any known family history of CAD or family history of sudden cardiac death.  No reported history of hypertension, dyslipidemia, diabetes.  The history is provided by the patient. No language interpreter was used.  Chest Pain   Past Medical History:  Diagnosis Date  . Abnormal Pap smear   . Obesity     Patient Active Problem List   Diagnosis Date Noted  . Mood and affect disturbance 07/24/2013  . Obesity, unspecified 07/24/2013  . Nicotine addiction 07/24/2013  . Uses marijuana  07/24/2013  . Oligomenorrhea 12/22/2011    History reviewed. No pertinent surgical history.   OB History    Gravida  0   Para      Term      Preterm      AB      Living  0     SAB      TAB      Ectopic      Multiple      Live Births               Home Medications    Prior to Admission medications   Medication Sig Start Date End Date Taking? Authorizing Provider  acetaminophen (TYLENOL) 325 MG tablet Take 650 mg by mouth every 6 (six) hours as needed for mild pain.    [provider]  hydrocortisone 2.5 % lotion Apply topically 2 (two) times daily. Patient not taking: Reported on 09/16/2015 05/10/15   Waynetta Pean, PA-C  ibuprofen (ADVIL,MOTRIN) 800 MG tablet Take 1 tablet (800 mg total) by mouth 3 (three) times daily. 09/16/15   Gloriann Loan, PA-C  loratadine (CLARITIN) 10 MG tablet Take 1 tablet (10 mg total) by mouth daily. Patient not taking: Reported on 09/16/2015 04/23/15   Delos Haring, PA-C  methocarbamol (ROBAXIN) 500 MG tablet Take 1 tablet (500 mg total) by mouth 2 (two) times daily. 11/25/17   Jacqlyn Larsen, PA-C  metroNIDAZOLE (FLAGYL) 500 MG tablet Take 1 tablet (500 mg total) by mouth 2 (two) times daily. Patient not taking: Reported on 09/16/2015 12/02/14   Constant, Vickii Chafe, MD  naproxen (NAPROSYN) 500 MG tablet Take 1 tablet (500 mg total) by mouth 2 (two) times daily. 11/25/17   Dartha LodgeFord, Kelsey N, PA-C  pantoprazole (PROTONIX) 20 MG tablet Take 2 tablets (40 mg total) by mouth daily. 01/29/19   Antony MaduraHumes, Cydnie Deason, PA-C    Family History Family History  Problem Relation Age of Onset  . Diabetes Mother   . Hypertension Mother   . Breast cancer Maternal Grandmother     Social History Social History   Tobacco Use  . Smoking status: Current Every Day Smoker    Packs/day: 1.00    Years: 10.00    Pack years: 10.00    Types: Cigarettes    Start date: 07/25/2003  . Smokeless tobacco: Never Used  Substance Use Topics  . Alcohol use: Yes     Comment: occasionally  . Drug use: No    Types: Marijuana, Cocaine    Comment: Hx cocaine & marijuana use     Allergies   Benadryl [diphenhydramine hcl]   Review of Systems Review of Systems  Cardiovascular: Positive for chest pain.  Ten systems reviewed and are negative for acute change, except as noted in the HPI.    Physical Exam Updated Vital Signs BP (!) 147/91 (BP Location: Right Arm)   Pulse 76   Temp 99 F (37.2 C) (Oral)   Resp 16   SpO2 100%   Physical Exam Vitals signs and nursing note reviewed.  Constitutional:      General: She is not in acute distress.    Appearance: She is well-developed. She is not diaphoretic.     Comments: Obese female, pleasant, nontoxic appearing  HENT:     Head: Normocephalic and atraumatic.  Eyes:     General: No scleral icterus.    Conjunctiva/sclera: Conjunctivae normal.  Neck:     Musculoskeletal: Normal range of motion.  Cardiovascular:     Rate and Rhythm: Normal rate and regular rhythm.     Pulses: Normal pulses.  Pulmonary:     Effort: Pulmonary effort is normal. No respiratory distress.     Breath sounds: No stridor. No wheezing, rhonchi or rales.     Comments: Respirations even and unlabored.  Lungs clear to auscultation bilaterally. Musculoskeletal: Normal range of motion.  Skin:    General: Skin is warm and dry.     Coloration: Skin is not pale.     Findings: No erythema or rash.  Neurological:     General: No focal deficit present.     Mental Status: She is alert and oriented to person, place, and time.     Coordination: Coordination normal.     Comments: GCS 15.  Moving extremities spontaneously.  Psychiatric:        Behavior: Behavior normal.      ED Treatments / Results  Labs (all labs ordered are listed, but only abnormal results are displayed) Labs Reviewed  BASIC METABOLIC PANEL - Abnormal; Notable for the following components:      Result Value   Potassium 3.4 (*)    Creatinine, Ser 1.01 (*)     Calcium 8.6 (*)    All other components within normal limits  CBC - Abnormal; Notable for the following components:   Hemoglobin 11.3 (*)    MCV 73.1 (*)    MCH 22.2 (*)    All other components within normal limits  TROPONIN I  I-STAT BETA HCG BLOOD, ED ([facility], [facility], [facility] ONLY)    EKG ED ECG REPORT  Date: 01/29/2019  Rate: 78  Rhythm: normal sinus rhythm  QRS Axis: normal  Intervals: normal  ST/T Wave abnormalities: normal  Conduction Disutrbances:none  Narrative Interpretation: NSR; limited by artifact. No STEMI.  Old EKG Reviewed: none available  I have personally reviewed the EKG tracing and agree with the computerized printout as noted.   Radiology Dg Chest 2 View  Result Date: 01/28/2019 CLINICAL DATA:  Chest pain. EXAM: CHEST - 2 VIEW COMPARISON:  11/25/2017 FINDINGS: The cardiomediastinal contours are normal. Mild central bronchial thickening. Pulmonary vasculature is normal. No consolidation, pleural effusion, or pneumothorax. No acute osseous abnormalities are seen. IMPRESSION: Mild central bronchial thickening can be seen with bronchitis or asthma. Electronically Signed   By: Narda RutherfordMelanie  Sanford M.D.   On: 01/28/2019 23:33    Procedures Procedures (including critical care time)  Medications Ordered in ED Medications  sodium chloride flush (NS) 0.9 % injection 3 mL (3 mLs Intravenous Not Given 01/29/19 0141)  alum & mag hydroxide-simeth (MAALOX/MYLANTA) 200-200-20 MG/5ML suspension 30 mL (30 mLs Oral Given 01/29/19 0233)    And  lidocaine (XYLOCAINE) 2 % viscous mouth solution 15 mL (15 mLs Oral Given 01/29/19 0233)     Initial Impression / Assessment and Plan / ED Course  I have reviewed the triage vital signs and the nursing notes.  Pertinent labs & imaging results that were available during my care of the patient were reviewed by me and considered in my medical decision making (see chart for details).     HEAR Score: 1  Patient presents to the emergency  department for evaluation of aching chest discomfort. Improving with belching, c/w GERD/gastritis.  EKG is nonischemic and troponin negative.  Chest x-ray without evidence of mediastinal widening to suggest dissection.  No pneumothorax, pneumonia, pleural effusion.  Pulmonary embolus further considered; however, patient without tachycardia, tachypnea, dyspnea, hypoxia.  Patient is PERC negative.  Plan for discharge on Protonix for management of presumed reflux.  She has been instructed to avoid coffee, citrus fruits, chocolate, spicy foods.  Encouraged follow-up with a primary care doctor.  Return precautions discussed and provided. Patient discharged in stable condition with no unaddressed concerns.   Final Clinical Impressions(s) / ED Diagnoses   Final diagnoses:  Gastroesophageal reflux disease, esophagitis presence not specified    ED Discharge Orders         Ordered    pantoprazole (PROTONIX) 20 MG tablet  Daily     01/29/19 0155           Antony MaduraHumes, Mireille Lacombe, PA-C 01/29/19 0423    Geoffery Lyonselo, Douglas, MD 01/29/19 0451

## 2019-01-29 NOTE — Discharge Instructions (Signed)
Your work-up in the emergency department been reassuring.  We recommend that you try to discontinue smoking.  You have been prescribed Protonix.  Take this as prescribed on a daily basis.  You may continue any other daily prescribed medicines as well.  Follow-up with a primary care doctor.

## 2019-03-02 IMAGING — DX DG CHEST 2V
2 series · 2 of 2 positions shown · non-contrast
Comparison: Radiographs 01/27/2009

CLINICAL DATA: Chest and thoracic back pain.

EXAM:
CHEST - 2 VIEW

[w chest pa]
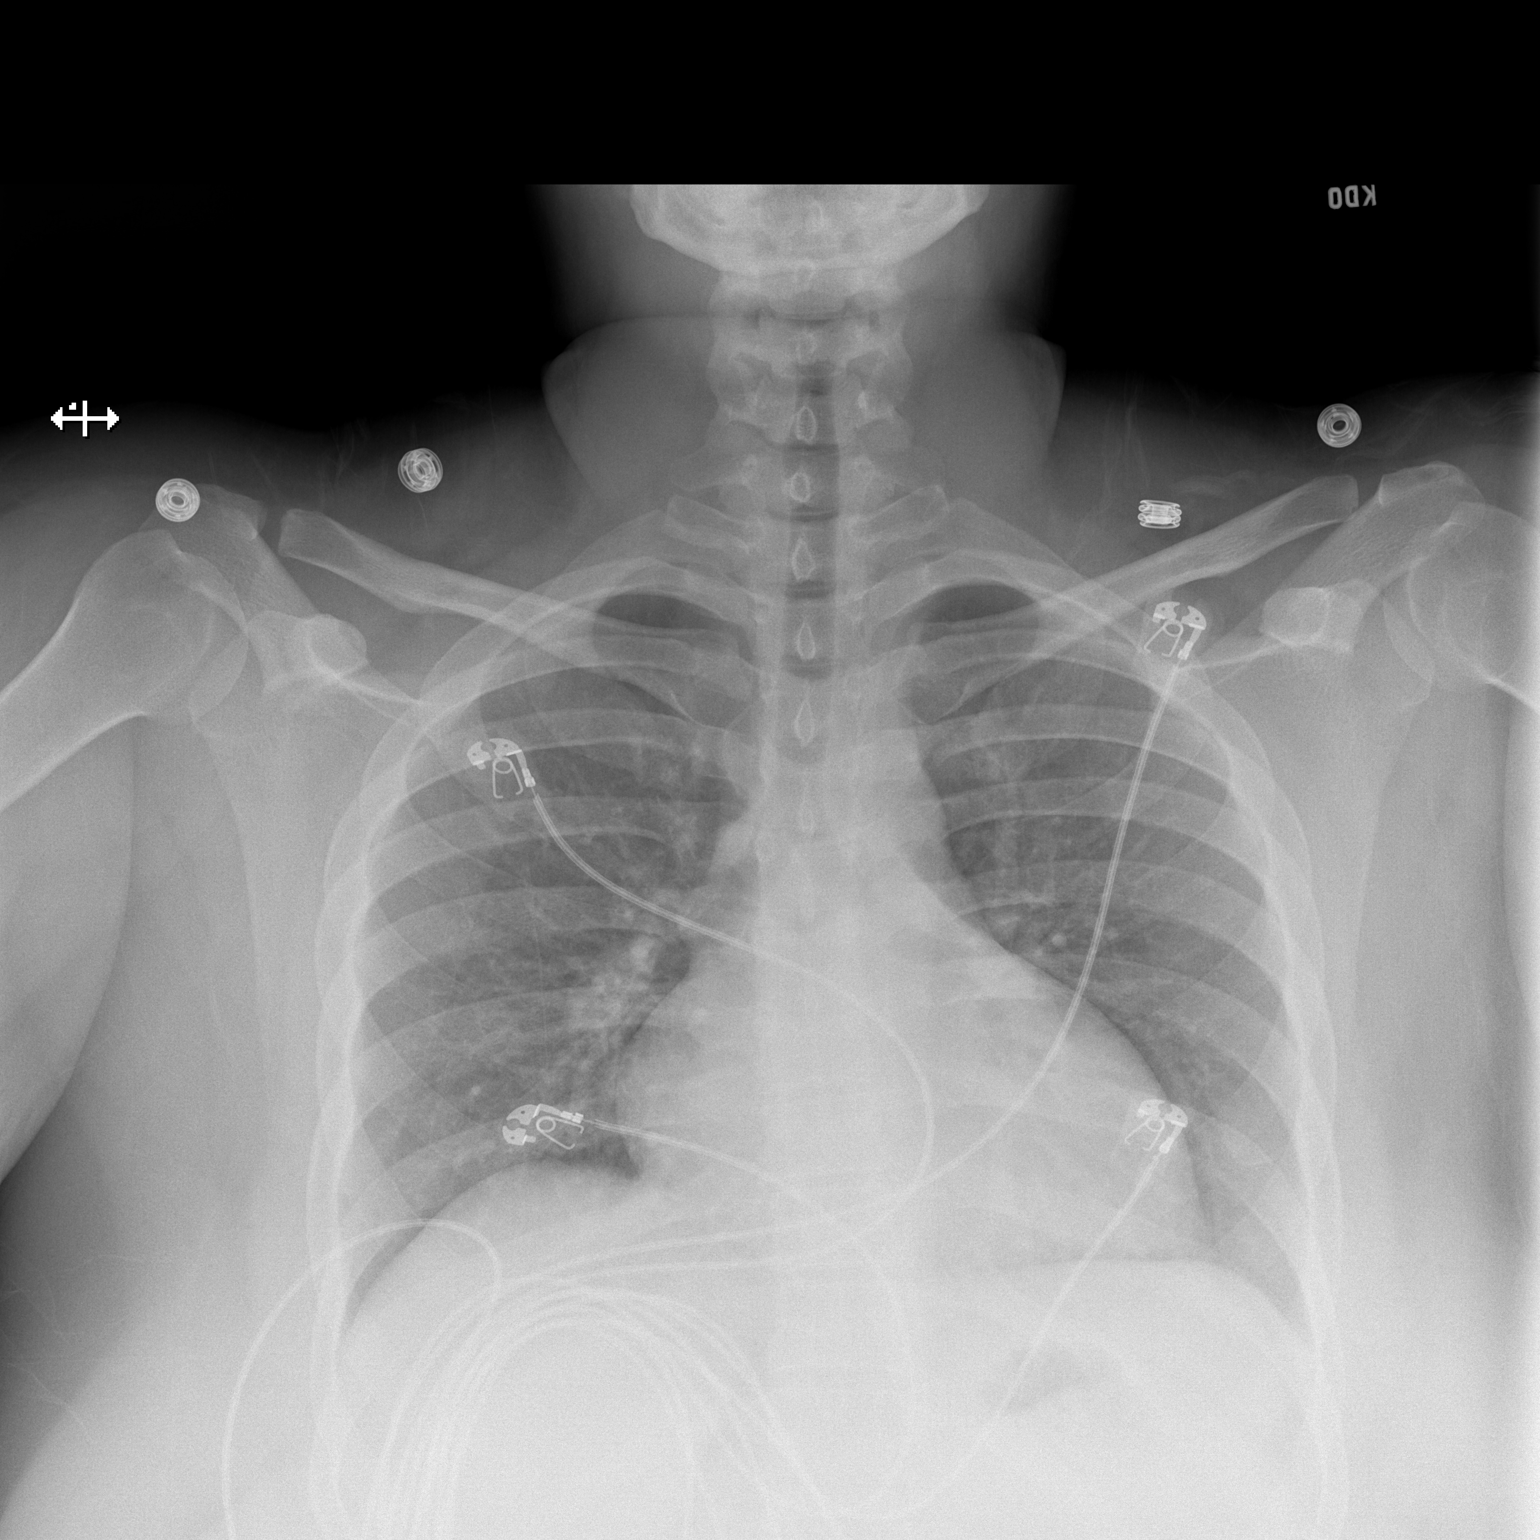

[w chest lat]
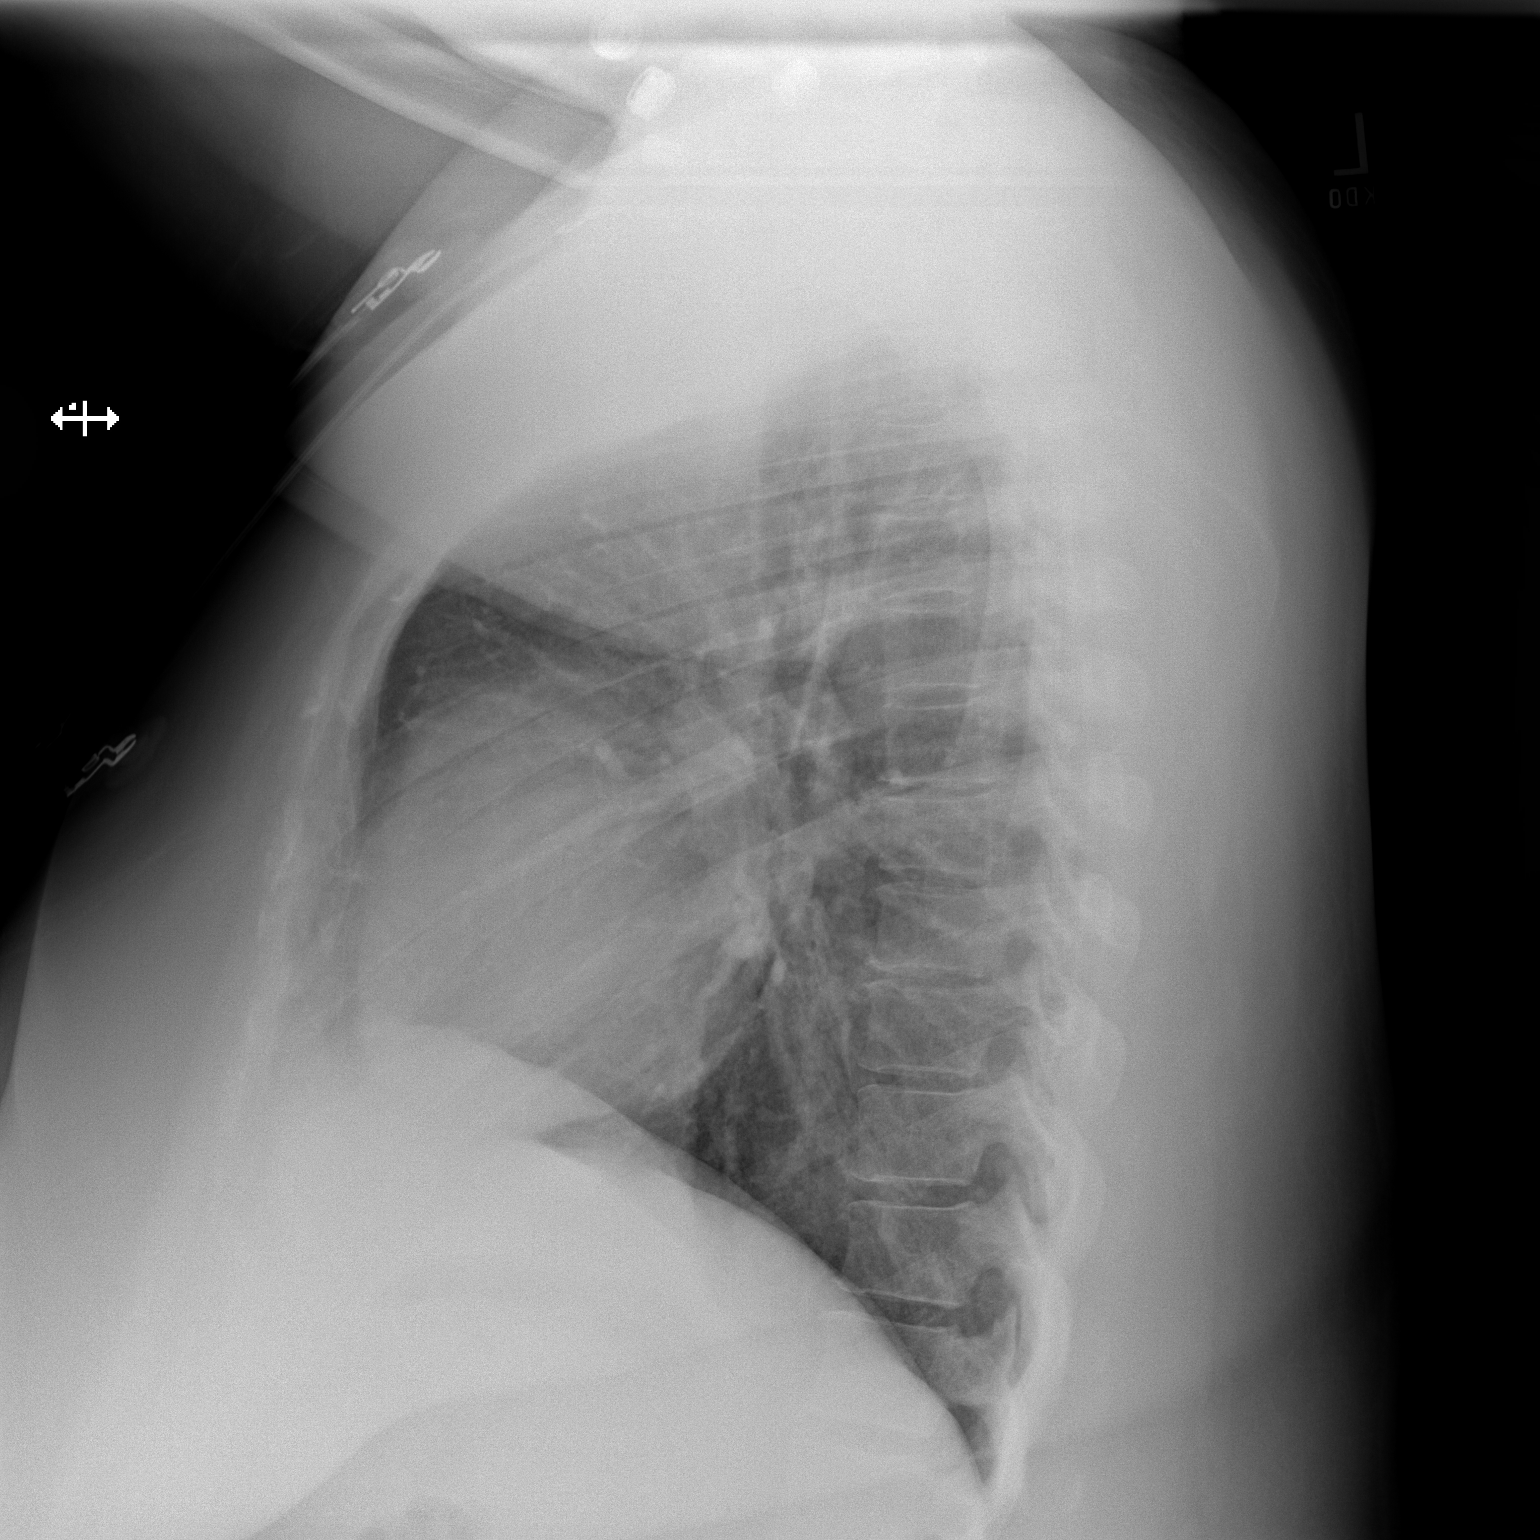

[2 of 2 positions shown; findings below may reference images not displayed]

FINDINGS: Low lung volumes.The cardiomediastinal contours are normal. The
lungs are clear. Pulmonary vasculature is normal. No consolidation,
pleural effusion, or pneumothorax. No acute osseous abnormalities
are seen. Trace endplate spurring in the midthoracic spine.
IMPRESSION: 1.  No acute pulmonary process.
2. Trace degenerative endplate spurring in the midthoracic spine.

## 2019-05-28 ENCOUNTER — Encounter (HOSPITAL_COMMUNITY): Payer: Self-pay | Admitting: *Deleted

## 2019-05-28 ENCOUNTER — Other Ambulatory Visit: Payer: Self-pay

## 2019-05-28 ENCOUNTER — Emergency Department (HOSPITAL_COMMUNITY)
Admission: EM | Admit: 2019-05-28 | Discharge: 2019-05-28 | Disposition: A | Discharge status: Home / Self Care | Payer: Self-pay | Attending: Emergency Medicine | Admitting: Emergency Medicine

## 2019-05-28 DIAGNOSIS — J029 Acute pharyngitis, unspecified: Secondary | ICD-10-CM | POA: Insufficient documentation

## 2019-05-28 DIAGNOSIS — F1210 Cannabis abuse, uncomplicated: Secondary | ICD-10-CM | POA: Insufficient documentation

## 2019-05-28 DIAGNOSIS — Z79899 Other long term (current) drug therapy: Secondary | ICD-10-CM | POA: Insufficient documentation

## 2019-05-28 DIAGNOSIS — F17210 Nicotine dependence, cigarettes, uncomplicated: Secondary | ICD-10-CM | POA: Insufficient documentation

## 2019-05-28 MED ORDER — AMOXICILLIN 500 MG PO CAPS: 500.0000 mg | ORAL_CAPSULE | Freq: Three times a day (TID) | ORAL | 0 refills | Status: DC

## 2019-05-28 MED ORDER — IBUPROFEN 800 MG PO TABS
800.0000 mg | ORAL_TABLET | Freq: Once | ORAL | Status: AC
Start: 2019-05-28 — End: 2019-05-28
  Administered 2019-05-28: 800 mg via ORAL
  Filled 2019-05-28: qty 1

## 2019-05-28 MED ORDER — AMOXICILLIN 500 MG PO CAPS
500.0000 mg | ORAL_CAPSULE | Freq: Once | ORAL | Status: AC
  Administered 2019-05-28: 500 mg via ORAL
  Filled 2019-05-28: qty 1

## 2019-05-28 NOTE — ED Notes (Signed)
Pt verbalized understanding of discharge paperwork, perscriptions and follow-up care

## 2019-05-28 NOTE — ED Provider Notes (Signed)
[facility] EMERGENCY DEPARTMENT Provider Note   CSN: [id number] Arrival date & time: 05/28/19  0539     History   Chief Complaint Chief Complaint  Patient presents with  . Sore Throat    HPI Linda Weber is a 33 y.o. female.     Patient presents to the emergency department with a chief complaint of sore throat.  She states that she is had a sore throat that started today.  She states that it is severe.  She denies any fevers.  States that she can feel it in her ear when she swallows.  She denies any known sick contacts.  Denies any other associated symptoms.  The history is provided by the patient. No language interpreter was used.    Past Medical History:  Diagnosis Date  . Abnormal Pap smear   . Obesity     Patient Active Problem List   Diagnosis Date Noted  . Mood and affect disturbance 07/24/2013  . Obesity, unspecified 07/24/2013  . Nicotine addiction 07/24/2013  . Uses marijuana 07/24/2013  . Oligomenorrhea 12/22/2011    History reviewed. No pertinent surgical history.   OB History    Gravida  0   Para      Term      Preterm      AB      Living  0     SAB      TAB      Ectopic      Multiple      Live Births               Home Medications    Prior to Admission medications   Medication Sig Start Date End Date Taking? Authorizing Provider  acetaminophen (TYLENOL) 325 MG tablet Take 650 mg by mouth every 6 (six) hours as needed for mild pain.    [provider]  hydrocortisone 2.5 % lotion Apply topically 2 (two) times daily. Patient not taking: Reported on 09/16/2015 05/10/15   Waynetta Pean, PA-C  ibuprofen (ADVIL,MOTRIN) 800 MG tablet Take 1 tablet (800 mg total) by mouth 3 (three) times daily. 09/16/15   Gloriann Loan, PA-C  loratadine (CLARITIN) 10 MG tablet Take 1 tablet (10 mg total) by mouth daily. Patient not taking: Reported on 09/16/2015 04/23/15   Delos Haring, PA-C  methocarbamol (ROBAXIN) 500  MG tablet Take 1 tablet (500 mg total) by mouth 2 (two) times daily. 11/25/17   Jacqlyn Larsen, PA-C  metroNIDAZOLE (FLAGYL) 500 MG tablet Take 1 tablet (500 mg total) by mouth 2 (two) times daily. Patient not taking: Reported on 09/16/2015 12/02/14   Constant, [name], MD  naproxen (NAPROSYN) 500 MG tablet Take 1 tablet (500 mg total) by mouth 2 (two) times daily. 11/25/17   Jacqlyn Larsen, PA-C  pantoprazole (PROTONIX) 20 MG tablet Take 2 tablets (40 mg total) by mouth daily. 01/29/19   Antonietta Breach, PA-C    Family History Family History  Problem Relation Age of Onset  . Diabetes Mother   . Hypertension Mother   . Breast cancer Maternal Grandmother     Social History Social History   Tobacco Use  . Smoking status: Current Every Day Smoker    Packs/day: 1.00    Years: 10.00    Pack years: 10.00    Types: Cigarettes    Start date: 07/25/2003  . Smokeless tobacco: Never Used  Substance Use Topics  . Alcohol use: Yes    Comment: occasionally  .  Drug use: No    Types: Marijuana, Cocaine    Comment: Hx cocaine & marijuana use     Allergies   Benadryl [diphenhydramine hcl]   Review of Systems Review of Systems  All other systems reviewed and are negative.    Physical Exam Updated Vital Signs BP 137/88   Pulse 80   Temp 99 F (37.2 C)   Resp 18   Ht 5\' 3"  (1.6 m)   Wt 129.3 kg   LMP 04/28/2019   SpO2 100%   BMI 50.50 kg/m   Physical Exam  Physical Exam  Constitutional: Pt  appears well-developed and well-nourished. No distress.  HENT:  Head: Normocephalic and atraumatic.  Right Ear: Tympanic membrane, external ear and ear canal normal.  Left Ear: Tympanic membrane, external ear and ear canal normal.  Nose: Nose normal. No mucosal edema or rhinorrhea.  Mouth/Throat: Uvula is midline and mucous membranes are normal. Mucous membranes are not dry. No trismus in the jaw. No uvula swelling. Posterior oropharynx with erythema, edema and exudate on the tonsils  Eyes:  Conjunctivae are normal.  Neck: Normal range of motion, full passive range of motion without pain and phonation normal. No tracheal tenderness, no spinous process tenderness and no muscular tenderness present. No rigidity. No erythema and normal range of motion present. No Brudzinski's sign and no Kernig's sign noted.  Range of motion without pain  No midline or paraspinal tenderness Normal phonation No stridor Handling secretions without difficulty No nuchal rigidity or meningeal signs  Cardiovascular: Normal rate, regular rhythm  Pulmonary/Chest: Effort normal and breath sounds normal. No stridor. No respiratory distress. No decreased breath sounds. No wheezes.  Equal chest expansion, clear and equal breath sounds without focal wheezes, rhonchi or rales  Musculoskeletal: Normal range of motion.  Neurological: Pt is alert.  Alert and oriented Moves all extremities without ataxia  Skin: Skin is warm and dry. Pt is not diaphoretic.  Psychiatric: Normal mood and affect.  Nursing note and vitals reviewed.     ED Treatments / Results  Labs (all labs ordered are listed, but only abnormal results are displayed) Labs Reviewed - No data to display  EKG None  Radiology No results found.  Procedures Procedures (including critical care time)  Medications Ordered in ED Medications  amoxicillin (AMOXIL) capsule 500 mg (has no administration in time range)  ibuprofen (ADVIL) tablet 800 mg (has no administration in time range)     Initial Impression / Assessment and Plan / ED Course  I have reviewed the triage vital signs and the nursing notes.  Pertinent labs & imaging results that were available during my care of the patient were reviewed by me and considered in my medical decision making (see chart for details).        Patient with sore throat.  Appears consistent with strep.  Final Clinical Impressions(s) / ED Diagnoses   Final diagnoses:  Pharyngitis, unspecified  etiology    ED Discharge Orders         Ordered    amoxicillin (AMOXIL) 500 MG capsule  3 times daily     05/28/19 0613           07/28/19, PA-C 05/28/19 07/28/19    Mesner, [id number], MD 05/28/19 [phone]

## 2019-05-28 NOTE — Discharge Instructions (Signed)
Take Tylenol and Motrin for pain.  Take antibiotics as directed.

## 2019-05-28 NOTE — ED Triage Notes (Signed)
The pt has had a sorethroat all day and today  No temp  Rt sided  Jaw and ear pain  lmp last month

## 2019-05-29 ENCOUNTER — Encounter (HOSPITAL_COMMUNITY): Payer: Self-pay | Admitting: Emergency Medicine

## 2019-05-29 ENCOUNTER — Other Ambulatory Visit: Payer: Self-pay

## 2019-05-29 ENCOUNTER — Ambulatory Visit (HOSPITAL_COMMUNITY)
Admission: EM | Admit: 2019-05-29 | Discharge: 2019-05-29 | Disposition: A | Discharge status: Home / Self Care | Payer: Self-pay | Attending: Family Medicine | Admitting: Family Medicine

## 2019-05-29 DIAGNOSIS — F17210 Nicotine dependence, cigarettes, uncomplicated: Secondary | ICD-10-CM

## 2019-05-29 DIAGNOSIS — J36 Peritonsillar abscess: Secondary | ICD-10-CM | POA: Insufficient documentation

## 2019-05-29 DIAGNOSIS — B9689 Other specified bacterial agents as the cause of diseases classified elsewhere: Secondary | ICD-10-CM

## 2019-05-29 DIAGNOSIS — Z20828 Contact with and (suspected) exposure to other viral communicable diseases: Secondary | ICD-10-CM | POA: Insufficient documentation

## 2019-05-29 MED ORDER — AMOXICILLIN-POT CLAVULANATE 875-125 MG PO TABS: 1.0000 | ORAL_TABLET | Freq: Two times a day (BID) | ORAL | 0 refills | Status: DC

## 2019-05-29 MED ORDER — KETOROLAC TROMETHAMINE 30 MG/ML IJ SOLN
30.0000 mg | Freq: Once | INTRAMUSCULAR | Status: AC
  Administered 2019-05-29: 30 mg via INTRAMUSCULAR

## 2019-05-29 MED ORDER — CEFTRIAXONE SODIUM 1 G IJ SOLR
1.0000 g | Freq: Once | INTRAMUSCULAR | Status: AC
  Administered 2019-05-29: 1 g via INTRAMUSCULAR

## 2019-05-29 MED ORDER — DEXAMETHASONE SODIUM PHOSPHATE 10 MG/ML IJ SOLN
INTRAMUSCULAR | Status: AC
  Filled 2019-05-29: qty 1

## 2019-05-29 MED ORDER — DEXAMETHASONE SODIUM PHOSPHATE 10 MG/ML IJ SOLN
10.0000 mg | Freq: Once | INTRAMUSCULAR | Status: AC
  Administered 2019-05-29: 10:00:00 10 mg via INTRAMUSCULAR

## 2019-05-29 MED ORDER — KETOROLAC TROMETHAMINE 30 MG/ML IJ SOLN
INTRAMUSCULAR | Status: AC
  Filled 2019-05-29: qty 1

## 2019-05-29 MED ORDER — LIDOCAINE HCL (PF) 1 % IJ SOLN
INTRAMUSCULAR | Status: AC
  Filled 2019-05-29: qty 4

## 2019-05-29 MED ORDER — CEFTRIAXONE SODIUM 1 G IJ SOLR
INTRAMUSCULAR | Status: AC
  Filled 2019-05-29: qty 10

## 2019-05-29 NOTE — ED Notes (Signed)
Nasal swab in lab 

## 2019-05-29 NOTE — ED Triage Notes (Addendum)
Sore throat started on Monday.  Patient was seen in ED on Monday 05/28/2019.  Patient was prescribed antibiotic.  Reports she has taken 4 amoxicillin since getting script filled.  Patient has written a note saying it "hurts to swallow, cough, and blow nose"  Patient has right ear pain, throat pain is mainly on right side of throat

## 2019-05-29 NOTE — ED Notes (Signed)
Patient in-take utilizing some written notes and gestures due to patient's throat hurting so bad that this is patient's choice of communication

## 2019-05-29 NOTE — ED Provider Notes (Signed)
[facility]    CSN: [id number] Arrival date & time: 05/29/19  0841      History   Chief Complaint Chief Complaint  Patient presents with  . Sore Throat    HPI Linda Weber is a 33 y.o. female.   Patient is a 33 year old female that presents today for severe throat pain, swelling.  Symptoms have been constant and worsening over the past few days.  She was seen yesterday in the ER and treated for strep throat with amoxicillin.  She has taken 4 of the pills and has been taking Tylenol for pain.  Her symptoms have worsened overnight.  The pain radiates into her right ear and is mainly on the right side of her throat.  Denies any fevers, chills, body aches.  Unable to eat.  Is able to swallow liquids.  ROS per HPI    Sore Throat    Past Medical History:  Diagnosis Date  . Abnormal Pap smear   . Obesity     Patient Active Problem List   Diagnosis Date Noted  . Mood and affect disturbance 07/24/2013  . Obesity, unspecified 07/24/2013  . Nicotine addiction 07/24/2013  . Uses marijuana 07/24/2013  . Oligomenorrhea 12/22/2011    History reviewed. No pertinent surgical history.  OB History    Gravida  0   Para      Term      Preterm      AB      Living  0     SAB      TAB      Ectopic      Multiple      Live Births               Home Medications    Prior to Admission medications   Medication Sig Start Date End Date Taking? Authorizing Provider  acetaminophen (TYLENOL) 325 MG tablet Take 650 mg by mouth every 6 (six) hours as needed for mild pain.   Yes [provider]  amoxicillin-clavulanate (AUGMENTIN) 875-125 MG tablet Take 1 tablet by mouth every 12 (twelve) hours. 05/29/19   Dahlia ByesBast, Traye Bates A, NP  ibuprofen (ADVIL,MOTRIN) 800 MG tablet Take 1 tablet (800 mg total) by mouth 3 (three) times daily. 09/16/15   Cheri Fowlerose, Kayla, PA-C  loratadine (CLARITIN) 10 MG tablet Take 1 tablet (10 mg total) by mouth daily. Patient not taking:  Reported on 09/16/2015 04/23/15 05/29/19  Marlon PelGreene, Tiffany, PA-C  pantoprazole (PROTONIX) 20 MG tablet Take 2 tablets (40 mg total) by mouth daily. 01/29/19 05/29/19  Antony MaduraHumes, Kelly, PA-C    Family History Family History  Problem Relation Age of Onset  . Diabetes Mother   . Hypertension Mother   . Breast cancer Maternal Grandmother     Social History Social History   Tobacco Use  . Smoking status: Current Every Day Smoker    Packs/day: 1.00    Years: 10.00    Pack years: 10.00    Types: Cigarettes    Start date: 07/25/2003  . Smokeless tobacco: Never Used  Substance Use Topics  . Alcohol use: Yes    Comment: occasionally  . Drug use: No    Types: Marijuana, Cocaine    Comment: Hx cocaine & marijuana use     Allergies   Benadryl [diphenhydramine hcl]   Review of Systems Review of Systems   Physical Exam Triage Vital Signs ED Triage Vitals  Enc Vitals Group     BP 05/29/19 0906 (!) 145/95  Pulse Rate 05/29/19 0906 94     Resp 05/29/19 0906 20     Temp 05/29/19 0906 97.6 F (36.4 C)     Temp Source 05/29/19 0906 Skin     SpO2 05/29/19 0906 100 %     Weight --      Height --      Head Circumference --      Peak Flow --      Pain Score 05/29/19 0859 10     Pain Loc --      Pain Edu? --      Excl. in [place]? --    No data found.  Updated Vital Signs BP (!) 145/95 (BP Location: Left Arm) Comment: regular cuff on left forearm  Pulse 94   Temp 97.6 F (36.4 C) (Skin)   Resp 20   LMP 05/29/2019   SpO2 100%   Visual Acuity Right Eye Distance:   Left Eye Distance:   Bilateral Distance:    Right Eye Near:   Left Eye Near:    Bilateral Near:     Physical Exam Vitals signs and nursing note reviewed.  Constitutional:      General: She is not in acute distress.    Appearance: She is ill-appearing. She is not toxic-appearing.  HENT:     Head: Normocephalic and atraumatic.     Right Ear: Tympanic membrane and ear canal normal.     Left Ear: Tympanic  membrane and ear canal normal.     Nose: No congestion or rhinorrhea.     Mouth/Throat:     Mouth: Mucous membranes are moist.     Pharynx: Pharyngeal swelling, oropharyngeal exudate, posterior oropharyngeal erythema and uvula swelling present.     Tonsils: Tonsillar exudate and tonsillar abscess present. 4+ on the right. 3+ on the left.     Comments: Tonsillar swelling more on the right with uvular deviation to the left. Erythema and exudates. Eyes:     Conjunctiva/sclera: Conjunctivae normal.  Pulmonary:     Effort: Pulmonary effort is normal.  Lymphadenopathy:     Cervical: Cervical adenopathy present.  Skin:    General: Skin is warm and dry.  Neurological:     Mental Status: She is alert.  Psychiatric:        Mood and Affect: Mood normal.        Behavior: Behavior normal.      UC Treatments / Results  Labs (all labs ordered are listed, but only abnormal results are displayed) Labs Reviewed  NOVEL CORONAVIRUS, NAA (HOSP ORDER, SEND-OUT TO REF LAB; TAT 18-24 HRS)    EKG   Radiology No results found.  Procedures Procedures (including critical care time)  Medications Ordered in UC Medications  dexamethasone (DECADRON) injection 10 mg (10 mg Intramuscular Given 05/29/19 0958)  cefTRIAXone (ROCEPHIN) injection 1 g (1 g Intramuscular Given 05/29/19 0958)  ketorolac (TORADOL) 30 MG/ML injection 30 mg (30 mg Intramuscular Given 05/29/19 0958)  ketorolac (TORADOL) 30 MG/ML injection (has no administration in time range)  dexamethasone (DECADRON) 10 MG/ML injection (has no administration in time range)  cefTRIAXone (ROCEPHIN) 1 g injection (has no administration in time range)  lidocaine (PF) (XYLOCAINE) 1 % injection (has no administration in time range)    Initial Impression / Assessment and Plan / UC Course  I have reviewed the triage vital signs and the nursing notes.  Pertinent labs & imaging results that were available during my care of the patient were reviewed  by me and  considered in my medical decision making (see chart for details).     Peritonsillar abscess.  Treating with Rocephin injection here in clinic along with dexamethasone for tonsillar swelling and inflammation.  Toradol for pain Sending Augmentin to the pharmacy for treatment outpatient. Contact given for ear nose and throat specialist to use and call as needed. Strict return precautions that if her symptoms worsen despite this treatment she will need to go straight to the hospital.  Patient understanding and agree. Final Clinical Impressions(s) / UC Diagnoses   Final diagnoses:  Peritonsillar abscess     Discharge Instructions     Peritonsillar abscess - we are giving you an antibiotic injection here in clinic today.  I am also giving you Decadron a steroid.  This will help with the tonsillar swelling and inflammation. Toradol injection given for pain Contact given for ear nose and throat specialist on your discharge instructions if this continues.  If this problem gets severely worse and you are unable to swallow your secretions and you are having trouble breathing you need to go straight back to the ER.     ED Prescriptions    Medication Sig Dispense Auth. Provider   amoxicillin-clavulanate (AUGMENTIN) 875-125 MG tablet Take 1 tablet by mouth every 12 (twelve) hours. 14 tablet Bonniejean Piano A, NP     PDMP not reviewed this encounter.   Dahlia Byes A, NP 05/29/19 1012

## 2019-05-29 NOTE — Discharge Instructions (Addendum)
Peritonsillar abscess - we are giving you an antibiotic injection here in clinic today.  I am also giving you Decadron a steroid.  This will help with the tonsillar swelling and inflammation. Toradol injection given for pain Contact given for ear nose and throat specialist on your discharge instructions if this continues.  If this problem gets severely worse and you are unable to swallow your secretions and you are having trouble breathing you need to go straight back to the ER.

## 2019-05-30 LAB — NOVEL CORONAVIRUS, NAA (HOSP ORDER, SEND-OUT TO REF LAB; TAT 18-24 HRS): SARS-CoV-2, NAA: NOT DETECTED

## 2019-06-12 ENCOUNTER — Other Ambulatory Visit: Payer: Self-pay

## 2019-06-12 DIAGNOSIS — Z20822 Contact with and (suspected) exposure to covid-19: Secondary | ICD-10-CM

## 2019-06-14 ENCOUNTER — Telehealth: Payer: Self-pay | Admitting: General Practice

## 2019-06-14 LAB — NOVEL CORONAVIRUS, NAA: SARS-CoV-2, NAA: NOT DETECTED

## 2019-06-14 NOTE — Telephone Encounter (Signed)
Negative COVID results given. Patient results "NOT Detected." Caller expressed understanding. ° °

## 2020-02-18 ENCOUNTER — Encounter (HOSPITAL_COMMUNITY): Payer: Self-pay

## 2020-02-18 ENCOUNTER — Ambulatory Visit (HOSPITAL_COMMUNITY)
Admission: EM | Admit: 2020-02-18 | Discharge: 2020-02-18 | Disposition: A | Discharge status: Home / Self Care | Payer: 59 | Attending: Family Medicine | Admitting: Family Medicine

## 2020-02-18 ENCOUNTER — Other Ambulatory Visit: Payer: Self-pay

## 2020-02-18 DIAGNOSIS — M79602 Pain in left arm: Secondary | ICD-10-CM | POA: Diagnosis not present

## 2020-02-18 DIAGNOSIS — T300 Burn of unspecified body region, unspecified degree: Secondary | ICD-10-CM | POA: Diagnosis not present

## 2020-02-18 MED ORDER — TRIAMCINOLONE ACETONIDE 0.1 % EX CREA: 1.0000 "application " | TOPICAL_CREAM | Freq: Two times a day (BID) | CUTANEOUS | 0 refills | Status: DC
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
## 2020-02-18 NOTE — Discharge Instructions (Signed)
You may use aloe vera gel to the burned area  I have sent in a steroid cream that you may also use to calm down the inflammation  You may continue to use a lidocaine spray for comfort  If any blisters arise, do not pop them or open them  Follow-up with this office or with primary care as needed  Follow-up with the ER for loss of sensation, fever, trouble breathing, trouble swallowing, other concerning symptoms
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
## 2021-01-22 ENCOUNTER — Ambulatory Visit (HOSPITAL_COMMUNITY)
Admission: EM | Admit: 2021-01-22 | Discharge: 2021-01-22 | Disposition: A | Discharge status: Home / Self Care | Payer: 59 | Attending: Student | Admitting: Student

## 2021-01-22 ENCOUNTER — Other Ambulatory Visit: Payer: Self-pay

## 2021-01-22 ENCOUNTER — Telehealth (HOSPITAL_BASED_OUTPATIENT_CLINIC_OR_DEPARTMENT_OTHER): Payer: Self-pay

## 2021-01-22 ENCOUNTER — Encounter (HOSPITAL_COMMUNITY): Payer: Self-pay

## 2021-01-22 DIAGNOSIS — R030 Elevated blood-pressure reading, without diagnosis of hypertension: Secondary | ICD-10-CM | POA: Diagnosis not present

## 2021-01-22 DIAGNOSIS — S46819A Strain of other muscles, fascia and tendons at shoulder and upper arm level, unspecified arm, initial encounter: Secondary | ICD-10-CM

## 2021-01-22 MED ORDER — TIZANIDINE HCL 2 MG PO CAPS: 2.0000 mg | ORAL_CAPSULE | Freq: Three times a day (TID) | ORAL | 0 refills | Status: DC

## 2021-01-22 NOTE — ED Triage Notes (Signed)
Pt presents with upper back pain and left shoulder pain x 2 days. Pt states the pain starts at the middle of her back and moves to her left shoulder. She states she used a heat pad to reduce the pain and states she think she pulled a muscle.

## 2021-01-22 NOTE — Discharge Instructions (Addendum)
-  Start the muscle relaxer-Zanaflex (tizanidine), up to 3 times daily for muscle spasms and pain.  This can make you drowsy, so take at bedtime or when you do not need to drive or operate machinery. -For additional relief, Take Tylenol 1000 mg 3 times daily, and ibuprofen 800 mg 3 times daily with food.  You can take these together, or alternate every 3-4 hours. -Heating pad/hot shower is also useful.  -Try to limit activities that cause pain -I placed a referral for you to establish care with a new primary care provider. -Please check your blood pressure at home or at the pharmacy. If this continues to be >140/90, follow-up with your primary care provider for further blood pressure management/ medication titration. If you develop chest pain, shortness of breath, vision changes, the worst headache of your life- head straight to the ED or call 911.

## 2021-01-22 NOTE — Telephone Encounter (Signed)
-----   Message from Aaron Edelman, RN sent at 01/22/2021  3:40 PM EDT ----- Regarding: UC to PCP Patient needs to establish with PCP - routine

## 2021-01-22 NOTE — ED Provider Notes (Signed)
[facility]    CSN: [id number] Arrival date & time: 01/22/21  1130      History   Chief Complaint Chief Complaint  Patient presents with   Shoulder Pain   Back Pain    HPI Linda Weber is a 35 y.o. female presenting with upper back and bilateral shoulder pain x2 days. History of similar 1 year ago. Her job involves a lot of heavy lifting and pulling. Over the counter medications providing minimal relief. Heat helps a bit. Denies pain shooting down arms or legs, denies numbness/tingling in arms/legs. Denies pain shooting down legs, denies numbness in arms/legs, denies weakness in arms/legs, denies saddle anesthesia, denies bowel/bladder incontinence.  Patient states she is concerned about her blood pressure, she does not currently follow with a primary care.  No formal diagnosis of hypertension. Denies dizziness, chest pain, headaches, weakness in arms or legs.  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 No pertinent surgical history.  OB History     Gravida  0   Para      Term      Preterm      AB      Living  0      SAB      IAB      Ectopic      Multiple      Live Births               Home Medications    Prior to Admission medications   Medication Sig Start Date End Date Taking? Authorizing Provider  tizanidine (ZANAFLEX) 2 MG capsule Take 1 capsule (2 mg total) by mouth 3 (three) times daily. 01/22/21  Yes Rhys Martini, PA-C  acetaminophen (TYLENOL) 325 MG tablet Take 650 mg by mouth every 6 (six) hours as needed for mild pain.    [provider]  amoxicillin-clavulanate (AUGMENTIN) 875-125 MG tablet Take 1 tablet by mouth every 12 (twelve) hours.  05/29/19   Dahlia Byes A, NP  ibuprofen (ADVIL,MOTRIN) 800 MG tablet Take 1 tablet (800 mg total) by mouth 3 (three) times daily. 09/16/15   Cheri Fowler, PA-C  triamcinolone cream (KENALOG) 0.1 % Apply 1 application topically 2 (two) times daily. 02/18/20   Moshe Cipro, NP  loratadine (CLARITIN) 10 MG tablet Take 1 tablet (10 mg total) by mouth daily. Patient not taking: Reported on 09/16/2015 04/23/15 05/29/19  Marlon Pel, PA-C  pantoprazole (PROTONIX) 20 MG tablet Take 2 tablets (40 mg total) by mouth daily. 01/29/19 05/29/19  Antony Madura, PA-C    Family History Family History  Problem Relation Age of Onset   Diabetes Mother    Hypertension Mother    Breast cancer Maternal Grandmother     Social History Social History   Tobacco Use   Smoking status: Every Day    Packs/day: 1.00    Years: 10.00    Pack years: 10.00    Types: Cigarettes    Start date: 07/25/2003   Smokeless tobacco: Never  Substance Use Topics   Alcohol use: Yes    Comment: occasionally   Drug use: No    Types: Marijuana, Cocaine    Comment: Hx cocaine &  marijuana use     Allergies   Benadryl [diphenhydramine hcl]   Review of Systems Review of Systems  Constitutional:  Negative for chills, fever and unexpected weight change.  Respiratory:  Negative for chest tightness and shortness of breath.   Cardiovascular:  Negative for chest pain and palpitations.  Gastrointestinal:  Negative for abdominal pain, diarrhea, nausea and vomiting.  Genitourinary:  Negative for decreased urine volume, difficulty urinating and frequency.  Musculoskeletal:  Positive for back pain (upper back). Negative for arthralgias, gait problem, joint swelling, myalgias, neck pain and neck stiffness.  Skin:  Negative for wound.  Neurological:  Negative for dizziness, tremors, seizures, syncope, facial asymmetry, speech difficulty, weakness, light-headedness, numbness and headaches.  All other systems reviewed and are  negative.   Physical Exam Triage Vital Signs ED Triage Vitals  Enc Vitals Group     BP 01/22/21 1242 (!) 173/128     Pulse Rate 01/22/21 1242 79     Resp 01/22/21 1242 18     Temp 01/22/21 1242 98.5 F (36.9 C)     Temp Source 01/22/21 1242 Oral     SpO2 01/22/21 1242 98 %     Weight --      Height --      Head Circumference --      Peak Flow --      Pain Score 01/22/21 1241 7     Pain Loc --      Pain Edu? --      Excl. in GC? --    No data found.  Updated Vital Signs BP (!) 172/121 (BP Location: Right Arm)   Pulse 79   Temp 98.5 F (36.9 C) (Oral)   Resp 18   SpO2 98%   Visual Acuity Right Eye Distance:   Left Eye Distance:   Bilateral Distance:    Right Eye Near:   Left Eye Near:    Bilateral Near:     Physical Exam Vitals reviewed.  Constitutional:      General: She is not in acute distress.    Appearance: Normal appearance. She is not ill-appearing.  HENT:     Head: Normocephalic and atraumatic.  Cardiovascular:     Rate and Rhythm: Normal rate and regular rhythm.     Heart sounds: Normal heart sounds.  Pulmonary:     Effort: Pulmonary effort is normal.     Breath sounds: Normal breath sounds and air entry.  Abdominal:     Tenderness: There is no abdominal tenderness. There is no right CVA tenderness, left CVA tenderness, guarding or rebound.     Comments: No bowel or bladder incontinence.  Musculoskeletal:     Cervical back: Normal range of motion. No swelling, deformity, signs of trauma, rigidity, spasms, tenderness, bony tenderness or crepitus. No pain with movement.     Thoracic back: No swelling, deformity, signs of trauma, spasms, tenderness or bony tenderness. Normal range of motion. No scoliosis.     Lumbar back: No swelling, deformity, signs of trauma, spasms, tenderness or bony tenderness. Normal range of motion. Negative right straight leg raise test and negative left straight leg raise test. No scoliosis.     Comments: Bilateral proximal  trapezius muscle tenderness.  Some bilateral cervical paraspinous muscle tenderness to deep palpation.  Pain elicited with flexion cervical spine and abduction bilateral arms.  No spinous deformity, tenderness, step-off.  Strength 5 out of 5 in upper and lower extremities, sensation intact.  Gait intact.  Grip strength 5/5.  Radial pulse 2+.  Absolutely no other injury, deformity, tenderness, ecchymosis, abrasion.  Neurological:     General: No focal deficit present.     Mental Status: She is alert.     Cranial Nerves: No cranial nerve deficit.  Psychiatric:        Mood and Affect: Mood normal.        Behavior: Behavior normal.        Thought Content: Thought content normal.        Judgment: Judgment normal.     UC Treatments / Results  Labs (all labs ordered are listed, but only abnormal results are displayed) Labs Reviewed - No data to display  EKG   Radiology No results found.  Procedures Procedures (including critical care time)  Medications Ordered in UC Medications - No data to display  Initial Impression / Assessment and Plan / UC Course  I have reviewed the triage vital signs and the nursing notes.  Pertinent labs & imaging results that were available during my care of the patient were reviewed by me and considered in my medical decision making (see chart for details).     This patient is a 35 year old female presenting with bilateral trapezius strain. No red flag symptoms  BP initially 173/128, on recheck- 172/121.  Denies dizziness, chest pain, shortness of breath, headaches, weakness.  Zanaflex sent as below. Tylenol/ibuprofen, RICE. Will avoid prednisone given elevated BP.  ED return precautions discussed.  PCP referral sent.  Final Clinical Impressions(s) / UC Diagnoses   Final diagnoses:  Elevated blood pressure reading without diagnosis of hypertension  Strain of trapezius muscle, unspecified laterality, initial encounter     Discharge  Instructions      -Start the muscle relaxer-Zanaflex (tizanidine), up to 3 times daily for muscle spasms and pain.  This can make you drowsy, so take at bedtime or when you do not need to drive or operate machinery. -For additional relief, Take Tylenol 1000 mg 3 times daily, and ibuprofen 800 mg 3 times daily with food.  You can take these together, or alternate every 3-4 hours. -Heating pad/hot shower is also useful.  -Try to limit activities that cause pain -I placed a referral for you to establish care with a new primary care provider. -Please check your blood pressure at home or at the pharmacy. If this continues to be >140/90, follow-up with your primary care provider for further blood pressure management/ medication titration. If you develop chest pain, shortness of breath, vision changes, the worst headache of your life- head straight to the ED or call 911.      ED Prescriptions     Medication Sig Dispense Auth. Provider   tizanidine (ZANAFLEX) 2 MG capsule Take 1 capsule (2 mg total) by mouth 3 (three) times daily. 21 capsule Rhys Martini, PA-C      PDMP not reviewed this encounter.   Rhys Martini, PA-C 01/22/21 1410

## 2021-01-23 ENCOUNTER — Emergency Department (HOSPITAL_COMMUNITY): Payer: 59

## 2021-01-23 ENCOUNTER — Emergency Department (HOSPITAL_COMMUNITY)
Admission: EM | Admit: 2021-01-23 | Discharge: 2021-01-23 | Disposition: A | Discharge status: Home / Self Care | Payer: 59 | Attending: Emergency Medicine | Admitting: Emergency Medicine

## 2021-01-23 ENCOUNTER — Other Ambulatory Visit: Payer: Self-pay

## 2021-01-23 ENCOUNTER — Encounter (HOSPITAL_COMMUNITY): Payer: Self-pay | Admitting: Emergency Medicine

## 2021-01-23 DIAGNOSIS — I10 Essential (primary) hypertension: Secondary | ICD-10-CM

## 2021-01-23 DIAGNOSIS — F17210 Nicotine dependence, cigarettes, uncomplicated: Secondary | ICD-10-CM | POA: Diagnosis not present

## 2021-01-23 DIAGNOSIS — R079 Chest pain, unspecified: Secondary | ICD-10-CM

## 2021-01-23 DIAGNOSIS — R519 Headache, unspecified: Secondary | ICD-10-CM | POA: Diagnosis present

## 2021-01-23 LAB — BASIC METABOLIC PANEL
Anion gap: 10 (ref 5–15)
BUN: 11 mg/dL (ref 6–20)
CO2: 26 mmol/L (ref 22–32)
Calcium: 9.1 mg/dL (ref 8.9–10.3)
Chloride: 101 mmol/L (ref 98–111)
Creatinine, Ser: 0.73 mg/dL (ref 0.44–1.00)
GFR, Estimated: >60 mL/min (ref >60)
Glucose, Bld: 103 mg/dL — ABNORMAL HIGH (ref 70–99)
Potassium: 3.3 mmol/L — ABNORMAL LOW (ref 3.5–5.1)
Sodium: 137 mmol/L (ref 135–145)

## 2021-01-23 LAB — CBC
HCT: 42.9 % (ref 36.0–46.0)
Hemoglobin: 13.2 g/dL (ref 12.0–15.0)
MCH: 23.2 pg — ABNORMAL LOW (ref 26.0–34.0)
MCHC: 30.8 g/dL (ref 30.0–36.0)
MCV: 75.3 fL — ABNORMAL LOW (ref 80.0–100.0)
Platelets: 308 10*3/uL (ref 150–400)
RBC: 5.7 MIL/uL — ABNORMAL HIGH (ref 3.87–5.11)
RDW: 16.7 % — ABNORMAL HIGH (ref 11.5–15.5)
WBC: 7.7 10*3/uL (ref 4.0–10.5)
nRBC: 0 % (ref 0.0–0.2)

## 2021-01-23 LAB — TROPONIN I (HIGH SENSITIVITY)
Troponin I (High Sensitivity): 8 ng/L (ref <18)
Troponin I (High Sensitivity): 8 ng/L (ref <18)

## 2021-01-23 LAB — POC URINE PREG, ED: Preg Test, Ur: NEGATIVE

## 2021-01-23 NOTE — ED Provider Notes (Signed)
Emergency Medicine Provider Triage Evaluation Note  Linda Weber , a 35 y.o. female  was evaluated in triage.  Pt complains of intermittent headaches for the past couple of weeks.  She states that she noticed blood pressure was very high.  He went to urgent care.  She was referred to the emergency department from urgent care.  She denies any numbness, weakness, tingling.  She states she has some sore muscles in her upper back and neck.  Denies any successful treatments prior to arrival..  Review of Systems  Positive: Headache, muscle soreness and upper back Negative: Fever, weakness  Physical Exam  BP (!) 186/117 (BP Location: Right Arm)   Pulse 93   Temp 98.6 F (37 C) (Oral)   Resp (!) 22   Ht 5\' 3"  (1.6 m)   Wt 136.1 kg   SpO2 100%   BMI 53.14 kg/m  Gen:   Awake, no distress   Resp:  Normal effort  MSK:   Moves extremities without difficulty  Other:  CN 3-12 intact, speech is clear, movements are goal oriented  Medical Decision Making  Medically screening exam initiated at 12:40 AM.  Appropriate orders placed.  Linda Weber was informed that the remainder of the evaluation will be completed by another provider, this initial triage assessment does not replace that evaluation, and the importance of remaining in the ED until their evaluation is complete.  Headache and elevated BP.   Carylon Perches, PA-C 01/23/21 0041    03/25/21, MD 01/24/21 [phone]

## 2021-01-23 NOTE — Discharge Instructions (Addendum)
You were evaluated in the Emergency Department and after careful evaluation, we did not find any emergent condition requiring admission or further testing in the hospital.  Your exam/testing today was overall reassuring.  Recommend follow-up with primary care doctor to discuss your high blood pressure and your symptoms.  Please return to the Emergency Department if you experience any worsening of your condition.  Thank you for allowing [place] to be a part of your care.

## 2021-01-23 NOTE — ED Provider Notes (Signed)
[facility] Emergency Department Provider Note MRN:  [id number]  Arrival date & time: 01/23/21     Chief Complaint   Headache and Hypertension   History of Present Illness   Linda Weber is a 35 y.o. year-old female with no prior past medical presenting to the ED with chief complaint of headache and hypertension.  On and off headaches for the past 1 to 2 weeks.  Also having some chest discomfort today.  Was seen at urgent care and found to be hypertensive, sent here for evaluation.  Dull frontal headaches.  Chest pain described as a burning, thinks it is related to acid reflux.  Denies shortness of breath, no fever or illness recently, no leg pain or swelling.  Has never been diagnosed with high blood pressure, but it runs in the family.  Smokes pack a day cigarettes.  Review of Systems  A complete 10 system review of systems was obtained and all systems are negative except as noted in the HPI and PMH.   Patient's Health History    Past Medical History:  Diagnosis Date   Abnormal Pap smear    Obesity     History reviewed. No pertinent surgical history.  Family History  Problem Relation Age of Onset   Diabetes Mother    Hypertension Mother    Breast cancer Maternal Grandmother     Social History   Socioeconomic History   Marital status: Single    Spouse name: Not on file   Number of children: Not on file   Years of education: Not on file   Highest education level: Not on file  Occupational History   Not on file  Tobacco Use   Smoking status: Every Day    Packs/day: 1.00    Years: 10.00    Pack years: 10.00    Types: Cigarettes    Start date: 07/25/2003   Smokeless tobacco: Never  Substance and Sexual Activity   Alcohol use: Yes    Comment: occasionally   Drug use: No    Types: Marijuana, Cocaine    Comment: Hx cocaine & marijuana use   Sexual activity: Yes    Birth control/protection: None  Other Topics Concern   Not on file  Social  History Narrative   Not on file   Social Determinants of Health   Financial Resource Strain: Not on file  Food Insecurity: Not on file  Transportation Needs: Not on file  Physical Activity: Not on file  Stress: Not on file  Social Connections: Not on file  Intimate Partner Violence: Not on file     Physical Exam   Vitals:   01/23/21 0215 01/23/21 0453  BP: 135/81 133/69  Pulse: 86 72  Resp: 16 16  Temp:  98.3 F (36.8 C)  SpO2: 100% 99%    CONSTITUTIONAL: Well-appearing, NAD NEURO:  Alert and oriented x 3, normal and symmetric strength and sensation, normal coordination, normal speech EYES:  eyes equal and reactive ENT/NECK:  no LAD, no JVD CARDIO: Regular rate, well-perfused, normal S1 and S2 PULM:  CTAB no wheezing or rhonchi GI/GU:  normal bowel sounds, non-distended, non-tender MSK/SPINE:  No gross deformities, no edema SKIN:  no rash, atraumatic PSYCH:  Appropriate speech and behavior  *Additional and/or pertinent findings included in MDM below  Diagnostic and Interventional Summary    EKG Interpretation  Date/Time:  Friday January 23 2021 02:09:58 EDT Ventricular Rate:  77 PR Interval:  144 QRS Duration: 85 QT Interval:  400 QTC Calculation: 453 R Axis:   43 Text Interpretation: Sinus rhythm Confirmed by Kennis Carina [phone]) on 01/23/2021 4:03:15 AM        Labs Reviewed  CBC - Abnormal; Notable for the following components:      Result Value   RBC 5.70 (*)    MCV 75.3 (*)    MCH 23.2 (*)    RDW 16.7 (*)    All other components within normal limits  BASIC METABOLIC PANEL - Abnormal; Notable for the following components:   Potassium 3.3 (*)    Glucose, Bld 103 (*)    All other components within normal limits  PREGNANCY, URINE  POC URINE PREG, ED  TROPONIN I (HIGH SENSITIVITY)  TROPONIN I (HIGH SENSITIVITY)    DG Chest 2 View  Final Result    CT HEAD WO CONTRAST  Final Result      Medications - No data to display   Procedures  /   Critical Care Procedures  ED Course and Medical Decision Making  I have reviewed the triage vital signs, the nursing notes, and pertinent available records from the EMR.  Listed above are laboratory and imaging tests that I personally ordered, reviewed, and interpreted and then considered in my medical decision making (see below for details).  Persistently hypertensive, 180s over 110s associated with headaches and with chest discomfort today.  Will need evaluation for hypertensive urgency or signs of endorgan damage.  CT head to exclude intracranial bleeding, troponin, kidney function testing, etc.     Work-up is reassuring, blood pressure has normalized without intervention, appropriate for discharge.  Elmer Sow. Pilar Plate, MD [facility] Emergency [facility] [email or web address]  Final Clinical Impressions(s) / ED Diagnoses     ICD-10-CM   1. Hypertension, unspecified type  I10     2. Chest pain  R07.9 DG Chest 2 View    DG Chest 2 View      ED Discharge Orders     None        Discharge Instructions Discussed with and Provided to Patient:     Discharge Instructions      You were evaluated in the Emergency Department and after careful evaluation, we did not find any emergent condition requiring admission or further testing in the hospital.  Your exam/testing today was overall reassuring.  Recommend follow-up with primary care doctor to discuss your high blood pressure and your symptoms.  Please return to the Emergency Department if you experience any worsening of your condition.  Thank you for allowing [place] to be a part of your care.         Sabas Sous, MD 01/23/21 [phone]

## 2021-01-23 NOTE — ED Triage Notes (Signed)
States having a intermittent headache 8pm. Was seen in urgent care for pulled muscle in back. Concerned for elevate BP during visit. States BP  was 171/121. No HX HTN

## 2021-02-13 ENCOUNTER — Other Ambulatory Visit (HOSPITAL_BASED_OUTPATIENT_CLINIC_OR_DEPARTMENT_OTHER): Payer: Self-pay

## 2021-02-13 ENCOUNTER — Ambulatory Visit (INDEPENDENT_AMBULATORY_CARE_PROVIDER_SITE_OTHER): Payer: 59 | Admitting: Nurse Practitioner

## 2021-02-13 ENCOUNTER — Other Ambulatory Visit: Payer: Self-pay

## 2021-02-13 ENCOUNTER — Encounter (HOSPITAL_BASED_OUTPATIENT_CLINIC_OR_DEPARTMENT_OTHER): Payer: Self-pay | Admitting: Nurse Practitioner

## 2021-02-13 VITALS — BP 156/106 | HR 90 | Temp 98.6°F | Ht 62.0 in | Wt 296.6 lb

## 2021-02-13 DIAGNOSIS — Z0000 Encounter for general adult medical examination without abnormal findings: Secondary | ICD-10-CM | POA: Insufficient documentation

## 2021-02-13 DIAGNOSIS — Z113 Encounter for screening for infections with a predominantly sexual mode of transmission: Secondary | ICD-10-CM

## 2021-02-13 DIAGNOSIS — Z8742 Personal history of other diseases of the female genital tract: Secondary | ICD-10-CM | POA: Insufficient documentation

## 2021-02-13 DIAGNOSIS — Z789 Other specified health status: Secondary | ICD-10-CM | POA: Insufficient documentation

## 2021-02-13 DIAGNOSIS — I10 Essential (primary) hypertension: Secondary | ICD-10-CM

## 2021-02-13 DIAGNOSIS — F17219 Nicotine dependence, cigarettes, with unspecified nicotine-induced disorders: Secondary | ICD-10-CM

## 2021-02-13 DIAGNOSIS — Z7689 Persons encountering health services in other specified circumstances: Secondary | ICD-10-CM

## 2021-02-13 DIAGNOSIS — R4586 Emotional lability: Secondary | ICD-10-CM

## 2021-02-13 DIAGNOSIS — N926 Irregular menstruation, unspecified: Secondary | ICD-10-CM

## 2021-02-13 DIAGNOSIS — K219 Gastro-esophageal reflux disease without esophagitis: Secondary | ICD-10-CM | POA: Insufficient documentation

## 2021-02-13 DIAGNOSIS — Z7289 Other problems related to lifestyle: Secondary | ICD-10-CM

## 2021-02-13 DIAGNOSIS — F1090 Alcohol use, unspecified, uncomplicated: Secondary | ICD-10-CM

## 2021-02-13 HISTORY — DX: Alcohol use, unspecified, uncomplicated: F10.90

## 2021-02-13 HISTORY — DX: Encounter for general adult medical examination without abnormal findings: Z00.00

## 2021-02-13 HISTORY — DX: Other specified health status: Z78.9

## 2021-02-13 HISTORY — DX: Personal history of other diseases of the female genital tract: Z87.42

## 2021-02-13 LAB — POCT GLYCOSYLATED HEMOGLOBIN (HGB A1C): Hemoglobin A1C: 5.5 % (ref 4.0–5.6)

## 2021-02-13 MED ORDER — DILTIAZEM HCL ER COATED BEADS 120 MG PO CP24
120.0000 mg | ORAL_CAPSULE | Freq: Every day | ORAL | 1 refills | Status: DC
  Filled 2021-02-13: qty 30, 30d supply, fill #0

## 2021-02-13 MED ORDER — BLOOD PRESSURE MONITOR MISC
1.0000 [IU] | Freq: Every day | 0 refills | Status: DC
  Filled 2021-02-13: qty 1, 30d supply, fill #0

## 2021-02-13 NOTE — Assessment & Plan Note (Signed)
Daily smoking 1PPD for "many years" No respiratory complications noted today.  Recommend smoking cessation to help with HTN and to prevent complications.  Discussed with patient and information provided.

## 2021-02-13 NOTE — Assessment & Plan Note (Signed)
Elevation in BP with associated symptoms present.  Concerns for changes to vision with hypertension.  She is also experiencing easy bruising, however, this has been lifelong and in the setting of heavy menses, it is unclear if other etiology is present.  Recommend: Exercise of walking 20 minutes every day Decrease caloric intake to no more than 1500 calories per day with no more than 200g carbohydrates daily. Plan to start diltiazem 163m once a day- HR is regular, but on higher side on assessment (in mid 90's) Monitor BP at home daily and f/u with [facility]V in 2 weeks.  Will likely need to add ARB, but will start with this an monitor.  F/U if abnormal symptoms occur.

## 2021-02-13 NOTE — Patient Instructions (Addendum)
Recommendations from today's visit: Check your BP once a day every day and write it down.  We will have a virtual visit in 2 weeks and see what your BP has been looking like If you have any new or worsening symptoms- go to the emergency room or call here We will get labs today and plan to do your physical exam in a month or 2 once we get your BP under control.  Information on diet, exercise, and health maintenance recommendations are listed below. This is information to help you be sure you are on track for optimal health and monitoring.   Please look over this and let us know if you have any questions or if you have completed any of the health maintenance outside of [facility] so that we can be sure your records are up to date.  ___________________________________________________________  Thank you for choosing [facility] for your Primary Care needs. I am excited for the opportunity to partner with you to meet your health care goals. It was a pleasure meeting you today!  I am an Adult-Geriatric Nurse Practitioner with a background in caring for patients for more than 20 years. I received my Paediatric nurse in Nursing and my Doctor of Nursing Practice degrees at [employer]. I received additional fellowship training in primary care and sports medicine after receiving my doctorate degree. I provide primary care and sports medicine services to patients age 21 and older within this office. I am also a provider with the [facility] and the director of the APP Fellowship with [employer].  I am a [place] native, but have called the [place] area home for nearly 20 years and am proud to be a member of this community.   I am passionate about providing the best service to you through preventive medicine and supportive care. I consider you a part of the medical team and value your input. I work diligently to ensure that you  are heard and your needs are met in a safe and effective manner. I want you to feel comfortable with me as your provider and want you to know that your health concerns are important to me.   For your information, our office hours are Monday- Friday 8:00 AM - 5:00 PM At this time I am not in the office on Wednesdays.  If you have questions or concerns, please call our office at [phone] or send [place] a MyChart message and we will respond as quickly as possible.   For all urgent or time sensitive needs we ask that you please call the office to avoid delays. MyChart is not constantly monitored and replies may take up to 72 business hours.  MyChart Policy: MyChart allows for you to see your visit notes, after visit summary, provider recommendations, lab and tests results, make an appointment, request refills, and contact your provider or the office for non-urgent questions or concerns.  Providers are seeing patients during normal business hours and do not have built in time to review MyChart messages. We ask that you allow a minimum of 72 business hours for MyChart message responses.  Complex MyChart concerns may require a visit. Your provider may request you schedule a virtual or in person visit to ensure we are providing the best care possible. MyChart messages sent after 4:00 PM on Friday will not be received by the provider until Monday morning.    Lab and Test Results: You will  receive your lab and test results on MyChart as soon as they are completed and results have been sent by the lab or testing facility. Due to this service, you will receive your results BEFORE your provider.  Please allow a minimum of 72 business hours for your provider to receive and review lab and test results and contact you about.   Most lab and test result comments from the provider will be sent through [facility]. Your provider may recommend changes to the plan of care, follow-up visits, repeat testing, ask questions,  or request an office visit to discuss these results. You may reply directly to this message or call the office at [phone] to provide information for the provider or set up an appointment. In some instances, you will be called with test results and recommendations. Please let us know if this is preferred and we will make note of this in your chart to provide this for you.    If you have not heard a response to your lab or test results in 72 business hours, please call the office to let us know.   After Hours: For all non-emergency after hours needs, please call the office at [phone] and select the option to reach the on-call provider service. On-call services are shared between multiple [facility] offices and therefore it will not be possible to speak directly with your provider. On-call providers may provide medical advice and recommendations, but are unable to provide refills for maintenance medications.  For all emergency or urgent medical needs after normal business hours, we recommend that you seek care at the closest Urgent Care or Emergency Department to ensure appropriate treatment in a timely manner.  [facility] has a 24 hour emergency room located on the ground floor for your convenience.    Please do not hesitate to reach out to [place] with concerns.   Thank you, again, for choosing me as your health care partner. I appreciate your trust and look forward to learning more about you.   Worthy Keeler, DNP, AGNP-c ___________________________________________________________  Health Maintenance Recommendations Screening Testing Mammogram Every 1 -2 years based on history and risk factors Starting at age 3 Pap Smear Ages 21-39 every 3 years Ages 11-65 every 5 years with HPV testing More frequent testing may be required based on results and history Colon Cancer Screening Every 1-10 years based on test performed, risk factors, and history Starting at age  62 Bone Density Screening Every 2-10 years based on history Starting at age 67 for women Recommendations for men differ based on medication usage, history, and risk factors AAA Screening One time ultrasound Men 17-66 years old who have every smoked Lung Cancer Screening Low Dose Lung CT every 12 months Age 41-80 years with a 30 pack-year smoking history who still smoke or who have quit within the last 15 years  Screening Labs Routine  Labs: Complete Blood Count (CBC), Complete Metabolic Panel (CMP), Cholesterol (Lipid Panel) Every 6-12 months based on history and medications May be recommended more frequently based on current conditions or previous results Hemoglobin A1c Lab Every 3-12 months based on history and previous results Starting at age 39 or earlier with diagnosis of diabetes, high cholesterol, BMI >26, and/or risk factors Frequent monitoring for patients with diabetes to ensure blood sugar control Thyroid Panel (TSH w/ T3 & T4) Every 6 months based on history, symptoms, and risk factors May be repeated more often if on medication HIV One time testing for all patients  53 and older May be repeated more frequently for patients with increased risk factors or exposure Hepatitis C One time testing for all patients 22 and older May be repeated more frequently for patients with increased risk factors or exposure Gonorrhea, Chlamydia Every 12 months for all sexually active persons 13-24 years Additional monitoring may be recommended for those who are considered high risk or who have symptoms PSA Men 59-56 years old with risk factors Additional screening may be recommended from age 64-69 based on risk factors, symptoms, and history  Vaccine Recommendations Tetanus Booster All adults every 10 years Flu Vaccine All patients 6 months and older every year COVID Vaccine All patients 12 years and older Initial dosing with booster May recommend additional booster based on age  and health history HPV Vaccine 2 doses all patients age 46-26 Dosing may be considered for patients over 26 Shingles Vaccine (Shingrix) 2 doses all adults 1 years and older Pneumonia (Pneumovax 23) All adults 15 years and older May recommend earlier dosing based on health history Pneumonia (Prevnar 4) All adults 42 years and older Dosed 1 year after Pneumovax 23  Additional Screening, Testing, and Vaccinations may be recommended on an individualized basis based on family history, health history, risk factors, and/or exposure.  __________________________________________________________  Diet Recommendations for All Patients  I recommend that all patients maintain a diet low in saturated fats, carbohydrates, and cholesterol. While this can be challenging at first, it is not impossible and small changes can make big differences.  Things to try: Decreasing the amount of soda, sweet tea, and/or juice to one or less per day and replace with water While water is always the first choice, if you do not like water you may consider adding a water additive without sugar to improve the taste other sugar free drinks Replace potatoes with a brightly colored vegetable at dinner Use healthy oils, such as canola oil or olive oil, instead of butter or hard margarine Limit your bread intake to two pieces or less a day Replace regular pasta with low carb pasta options Bake, broil, or grill foods instead of frying Monitor portion sizes  Eat smaller, more frequent meals throughout the day instead of large meals  An important thing to remember is, if you love foods that are not great for your health, you don't have to give them up completely. Instead, allow these foods to be a reward when you have done well. Allowing yourself to still have special treats every once in a while is a nice way to tell yourself thank you for working hard to keep yourself healthy.   Also remember that every day is a new day. If  you have a bad day and "fall off the wagon", you can still climb right back up and keep moving along on your journey!  We have resources available to help you!  Some websites that may be helpful include: www.http://carter.biz/  Www.VeryWellFit.com _____________________________________________________________  Activity Recommendations for All Patients  I recommend that all adults get at least 20 minutes of moderate physical activity that elevates your heart rate at least 5 days out of the week.  Some examples include: Walking or jogging at a pace that allows you to carry on a conversation Cycling (stationary bike or outdoors) Water aerobics Yoga Weight lifting Dancing If physical limitations prevent you from putting stress on your joints, exercise in a pool or seated in a chair are excellent options.  Do determine your MAXIMUM heart rate for activity: YOUR AGE -  220 = MAX HeartRate   Remember! Do not push yourself too hard.  Start slowly and build up your pace, speed, weight, time in exercise, etc.  Allow your body to rest between exercise and get good sleep. You will need more water than normal when you are exerting yourself. Do not wait until you are thirsty to drink. Drink with a purpose of getting in at least 8, 8 ounce glasses of water a day plus more depending on how much you exercise and sweat.    If you begin to develop dizziness, chest pain, abdominal pain, jaw pain, shortness of breath, headache, vision changes, lightheadedness, or other concerning symptoms, stop the activity and allow your body to rest. If your symptoms are severe, seek emergency evaluation immediately. If your symptoms are concerning, but not severe, please let us know so that we can recommend further evaluation.   ________________________________________________________________

## 2021-02-13 NOTE — Assessment & Plan Note (Signed)
Hx of anxiety and depression symptoms with recreational drug use (marijuana) and alcohol overuse (4 drinks per day).  Record reveals family hx of bipolar disorder.  Discussed counseling and medication today. She wishes to think about both of these.  I feel that both would greatly benefit her, but want her to feel comfortable and confident with this decision.  Plan to discuss further at upcoming visit.

## 2021-02-13 NOTE — Assessment & Plan Note (Signed)
Reportedly consuming about 4 alcoholic beverages per day, usually in the evenings.  Expresses concern with overuse. Discussed counseling services given recent depression and anxiety, but she wishes to think about this at this time.  Recommend that she follow-up if she would like to received help.  Recommend no more than 1 alcohol drink per day for women.  In cases where alcohol use cannot be limited, recommend complete avoidance to prevent overuse.

## 2021-02-13 NOTE — Assessment & Plan Note (Signed)
Pantoprazole cost prohibitive. Recommend famotidine twice a day for preventative measures. Reduce alcohol consumption and work to decrease nicotine use as this can worsen GERD. Eat small meals and stop when full If symptoms worsen or persist, may need additional intervention.  GI referral may be required.

## 2021-02-13 NOTE — Assessment & Plan Note (Signed)
BMI 54.25 today Recommend diet and activity modification to help with increased weight gain.  Will monitor labs today In the setting of PCOS, this is likely a component.  Concerns present for possible DM given family history, weight, and HTN.  Strongly recommend considering counseling services to help with weight and mood.  She will consider.

## 2021-02-13 NOTE — Assessment & Plan Note (Signed)
Heavy menses and missed menses with hx of PCOS.  Not on OCP.  Most recent menses in March of this year.  Concern for possible bleeding irregularity given heavy periods and history of easy bruising.  Will evaluate labs today. OCP not recommended at this time due to hypertension uncontrolled.  Will refer to GYN for recommendations
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
## 2021-02-13 NOTE — Assessment & Plan Note (Signed)
Review of current and past medical history, social history, medication, and family history.  Review of care gaps and health maintenance recommendations.  Records from recent providers to be requested if not available in Chart Review or Care Everywhere.  Recommendations for health maintenance, diet, and exercise provided.   

## 2021-02-14 LAB — CBC WITH DIFFERENTIAL/PLATELET
Basophils Absolute: 0.1 10*3/uL (ref 0.0–0.2)
Basos: 1 %
EOS (ABSOLUTE): 0.1 10*3/uL (ref 0.0–0.4)
Eos: 2 %
Hematocrit: 43.8 % (ref 34.0–46.6)
Hemoglobin: 14.1 g/dL (ref 11.1–15.9)
Immature Grans (Abs): 0 10*3/uL (ref 0.0–0.1)
Immature Granulocytes: 0 %
Lymphocytes Absolute: 1.7 10*3/uL (ref 0.7–3.1)
Lymphs: 28 %
MCH: 23.2 pg — ABNORMAL LOW (ref 26.6–33.0)
MCHC: 32.2 g/dL (ref 31.5–35.7)
MCV: 72 fL — ABNORMAL LOW (ref 79–97)
Monocytes Absolute: 0.5 10*3/uL (ref 0.1–0.9)
Monocytes: 8 %
Neutrophils Absolute: 3.7 10*3/uL (ref 1.4–7.0)
Neutrophils: 61 %
Platelets: 299 10*3/uL (ref 150–450)
RBC: 6.08 x10E6/uL — ABNORMAL HIGH (ref 3.77–5.28)
RDW: 16 % — ABNORMAL HIGH (ref 11.7–15.4)
WBC: 6.1 10*3/uL (ref 3.4–10.8)

## 2021-02-14 LAB — LIPID PANEL
Chol/HDL Ratio: 3.9 ratio (ref 0.0–4.4)
Cholesterol, Total: 165 mg/dL (ref 100–199)
HDL: 42 mg/dL (ref >39)
LDL Chol Calc (NIH): 95 mg/dL (ref 0–99)
Triglycerides: 159 mg/dL — ABNORMAL HIGH (ref 0–149)
VLDL Cholesterol Cal: 28 mg/dL (ref 5–40)

## 2021-02-14 LAB — COMPREHENSIVE METABOLIC PANEL
ALT: 23 IU/L (ref 0–32)
AST: 30 IU/L (ref 0–40)
Albumin/Globulin Ratio: 1.2 (ref 1.2–2.2)
Albumin: 4.2 g/dL (ref 3.8–4.8)
Alkaline Phosphatase: 88 IU/L (ref 44–121)
BUN/Creatinine Ratio: 11 (ref 9–23)
BUN: 9 mg/dL (ref 6–20)
Bilirubin Total: 0.4 mg/dL (ref 0.0–1.2)
CO2: 24 mmol/L (ref 20–29)
Calcium: 9.1 mg/dL (ref 8.7–10.2)
Chloride: 102 mmol/L (ref 96–106)
Creatinine, Ser: 0.84 mg/dL (ref 0.57–1.00)
Globulin, Total: 3.5 g/dL (ref 1.5–4.5)
Glucose: 101 mg/dL — ABNORMAL HIGH (ref 65–99)
Potassium: 3.6 mmol/L (ref 3.5–5.2)
Sodium: 140 mmol/L (ref 134–144)
Total Protein: 7.7 g/dL (ref 6.0–8.5)
eGFR: 93 mL/min/{1.73_m2} (ref >59)

## 2021-02-14 LAB — VITAMIN D 25 HYDROXY (VIT D DEFICIENCY, FRACTURES): Vit D, 25-Hydroxy: 21.5 ng/mL — ABNORMAL LOW (ref 30.0–100.0)

## 2021-02-14 LAB — PT AND PTT
INR: 1 (ref 0.9–1.2)
Prothrombin Time: 10.5 s (ref 9.1–12.0)
aPTT: 27 s (ref 24–33)

## 2021-02-14 LAB — TSH: TSH: 1.17 u[IU]/mL (ref 0.450–4.500)

## 2021-02-17 ENCOUNTER — Telehealth (HOSPITAL_BASED_OUTPATIENT_CLINIC_OR_DEPARTMENT_OTHER): Payer: Self-pay

## 2021-02-17 ENCOUNTER — Other Ambulatory Visit (HOSPITAL_BASED_OUTPATIENT_CLINIC_OR_DEPARTMENT_OTHER): Payer: Self-pay

## 2021-02-17 DIAGNOSIS — R718 Other abnormality of red blood cells: Secondary | ICD-10-CM

## 2021-02-17 NOTE — Telephone Encounter (Signed)
Called patient to discuss lab results and recommendations.  She is aware and understands.  Instructed her to contact the office with questions and concerns.

## 2021-02-17 NOTE — Telephone Encounter (Signed)
-----   Message from Tollie Eth, NP sent at 02/17/2021  8:04 AM EDT ----- Please let patient know: Red blood counts are off. Adding further testing for evaluation.  Vitamin D is low- recommend 800-1000iU of Vitamin D3 to help improve these levels.   Please call [facility] and add reflex Fe/TIBC/Transferrin, Reticulocyte Count, and B12/Folate Panel.

## 2021-02-17 NOTE — Progress Notes (Signed)
Please let patient know: Red blood counts are off. Adding further testing for evaluation.  Vitamin D is low- recommend 800-1000iU of Vitamin D3 to help improve these levels.   Please call [facility] and add reflex Fe/TIBC/Transferrin, Reticulocyte Count, and B12/Folate Panel.

## 2021-02-18 LAB — CT, NG, MYCOPLASMAS NAA, URINE
Chlamydia trachomatis, NAA: NEGATIVE
Mycoplasma genitalium NAA: NEGATIVE
Mycoplasma hominis NAA: POSITIVE — AB
Neisseria gonorrhoeae, NAA: NEGATIVE
Ureaplasma spp NAA: POSITIVE — AB

## 2021-02-19 NOTE — Progress Notes (Signed)
Please call patient:  Your STI testing came back positive for mycoplasma and ureaplasma bacterias. These bacterias are a normal part of the bacteria in the vagina in people who have been sexually active and most of the time do not cause problems, but can occasionally cause symptoms of vaginal and urinary symptoms. If symptoms are present, we need to treat them. Are you having any increased vaginal discharge, burning, itching, or pain? Any increased urination or burning with urination? If so- please let me know and I will send in treatment.

## 2021-02-20 ENCOUNTER — Telehealth (HOSPITAL_BASED_OUTPATIENT_CLINIC_OR_DEPARTMENT_OTHER): Payer: Self-pay

## 2021-02-20 LAB — IRON,TIBC AND FERRITIN PANEL
Ferritin: 43 ng/mL (ref 15–150)
Iron Saturation: 16 % (ref 15–55)
Iron: 65 ug/dL (ref 27–159)
Total Iron Binding Capacity: 401 ug/dL (ref 250–450)
UIBC: 336 ug/dL (ref 131–425)

## 2021-02-20 LAB — B12 AND FOLATE PANEL
Folate: 5 ng/mL (ref >3.0)
Vitamin B-12: 313 pg/mL (ref 232–1245)

## 2021-02-20 LAB — SPECIMEN STATUS REPORT

## 2021-02-20 NOTE — Telephone Encounter (Signed)
Called patient to discuss results and recommendations.  Patient states she is not having any increased vaginal discharge, burning, itching, or pain.  Instructed her to contact the office with any concerns or questions.

## 2021-02-20 NOTE — Telephone Encounter (Signed)
-----   Message from Tollie Eth, NP sent at 02/19/2021  1:46 PM EDT ----- Please call patient:  Your STI testing came back positive for mycoplasma and ureaplasma bacterias. These bacterias are a normal part of the bacteria in the vagina in people who have been sexually active and most of the time do not cause problems, but can occasionally cause symptoms of vaginal and urinary symptoms. If symptoms are present, we need to treat them. Are you having any increased vaginal discharge, burning, itching, or pain? Any increased urination or burning with urination? If so- please let me know and I will send in treatment.

## 2021-02-27 ENCOUNTER — Telehealth (INDEPENDENT_AMBULATORY_CARE_PROVIDER_SITE_OTHER): Payer: 59 | Admitting: Nurse Practitioner

## 2021-02-27 ENCOUNTER — Other Ambulatory Visit: Payer: Self-pay

## 2021-02-27 ENCOUNTER — Encounter (HOSPITAL_BASED_OUTPATIENT_CLINIC_OR_DEPARTMENT_OTHER): Payer: Self-pay | Admitting: Nurse Practitioner

## 2021-02-27 VITALS — Ht 62.0 in | Wt 297.0 lb

## 2021-02-27 DIAGNOSIS — I10 Essential (primary) hypertension: Secondary | ICD-10-CM | POA: Diagnosis not present

## 2021-02-27 NOTE — Progress Notes (Addendum)
Virtual Visit via Telephone Note  I connected with  Carylon Perches on 10/13/21 at  3:50 PM EDT by telephone and verified that I am speaking with the correct person using two identifiers.   I discussed the limitations, risks, security and privacy concerns of performing an evaluation and management service by telephone and the availability of in person appointments. I also discussed with the patient that there may be a patient responsible charge related to this service. The patient expressed understanding and agreed to proceed.  Participating parties included in this telephone visit include: The patient and the nurse practitioner listed.  The patient is: At home I am: In the office  Subjective:    CC: F/U HTN  HPI: MAXENE BYINGTON is a 35 y.o. year old female presenting today via telephone visit to discuss BP.  She reports that she did start the blood pressure medication, but only took it for one day due to getting sick shortly after taking the medication. She was not sure if it was the medication or another factor that caused her to become ill.  She has not tried the medication again, but reports she plans to start it this weekend.   She was unable to get a blood pressure cuff as they were out of the one that she wanted, but she reports she plans to get that this weekend so she can monitor her BP at home.   She has not been having any new or worsening symptoms. She expresses concerns about taking medication and is worried about dropping her BP too low.   Past medical history, Surgical history, Family history not pertinant except as noted below, Social history, Allergies, and medications have been entered into the medical record, reviewed, and corrections made.   Review of Systems:  All review of systems negative except what is listed in the HPI  Objective:    General:  Patient speaking clearly in complete sentences. No shortness of breath noted.   Alert and oriented x3.   Normal  judgment.  No apparent acute distress.  Impression and Recommendations:    1. Hypertension, unspecified type  Has not started medication routinely and not able to monitor BP at home yet.  Recommend starting medication at bedtime with a small snack and get the monitor as soon as possible.  If she gets sick after next dose, she will contact the office and let me know.  F/U in 1 week with [facility] BP readings.  F/U in 3 months in person for labs.       I discussed the assessment and treatment plan with the patient. The patient was provided an opportunity to ask questions and all were answered. The patient agreed with the plan and demonstrated an understanding of the instructions.   The patient was advised to call back or seek an in-person evaluation if the symptoms worsen or if the condition fails to improve as anticipated.  I provided 10 minutes of non-face-to-face time during this TELEPHONE encounter.    Tollie Eth, NP

## 2021-02-27 NOTE — Assessment & Plan Note (Signed)
Has not started medication routinely and not able to monitor BP at home yet.  Recommend starting medication at bedtime with a small snack and get the monitor as soon as possible.  If she gets sick after next dose, she will contact the office and let me know.  F/U in 1 week with [facility] BP readings.  F/U in 3 months in person for labs.

## 2021-02-27 NOTE — Patient Instructions (Signed)
Send me a message in about a week to let me know what your blood pressures are looking like. You will get a mychart message reminder for this.    If you throw up again from the medication, let me know.

## 2021-03-12 ENCOUNTER — Other Ambulatory Visit: Payer: Self-pay

## 2021-03-12 ENCOUNTER — Emergency Department (HOSPITAL_COMMUNITY): Payer: 59

## 2021-03-12 ENCOUNTER — Emergency Department (HOSPITAL_COMMUNITY)
Admission: EM | Admit: 2021-03-12 | Discharge: 2021-03-12 | Disposition: A | Discharge status: Home / Self Care | Payer: 59 | Attending: Emergency Medicine | Admitting: Emergency Medicine

## 2021-03-12 DIAGNOSIS — I10 Essential (primary) hypertension: Secondary | ICD-10-CM | POA: Diagnosis not present

## 2021-03-12 DIAGNOSIS — M25562 Pain in left knee: Secondary | ICD-10-CM | POA: Insufficient documentation

## 2021-03-12 DIAGNOSIS — W19XXXA Unspecified fall, initial encounter: Secondary | ICD-10-CM

## 2021-03-12 DIAGNOSIS — Y9301 Activity, walking, marching and hiking: Secondary | ICD-10-CM | POA: Diagnosis not present

## 2021-03-12 DIAGNOSIS — W010XXA Fall on same level from slipping, tripping and stumbling without subsequent striking against object, initial encounter: Secondary | ICD-10-CM | POA: Diagnosis not present

## 2021-03-12 DIAGNOSIS — F17210 Nicotine dependence, cigarettes, uncomplicated: Secondary | ICD-10-CM | POA: Insufficient documentation

## 2021-03-12 DIAGNOSIS — S8392XA Sprain of unspecified site of left knee, initial encounter: Secondary | ICD-10-CM | POA: Insufficient documentation

## 2021-03-12 DIAGNOSIS — S8992XA Unspecified injury of left lower leg, initial encounter: Secondary | ICD-10-CM | POA: Diagnosis present

## 2021-03-12 MED ORDER — HYDROCODONE-ACETAMINOPHEN 5-325 MG PO TABS
1.0000 | ORAL_TABLET | Freq: Once | ORAL | Status: AC
  Administered 2021-03-12: 1 via ORAL
  Filled 2021-03-12: qty 1

## 2021-03-12 MED ORDER — IBUPROFEN 600 MG PO TABS: 600.0000 mg | ORAL_TABLET | Freq: Four times a day (QID) | ORAL | 0 refills | Status: DC | PRN

## 2021-03-12 NOTE — ED Notes (Signed)
Discharge instructions including pain management and follow up care discussed with patient. Pt verbalized understanding with no questions at this time. Pt to go home with mother.

## 2021-03-12 NOTE — Discharge Instructions (Addendum)
Use the crutches to be weight bearing as tolerated. Ice and elevate to help reduce any inflammation. Take ibuprofen as prescribed.   If pain is no better in 3-4 days, follow up with Dr. Victorino Dike (ortho) for further evaluation.

## 2021-03-12 NOTE — ED Notes (Signed)
Ortho tech called knee immobilizer and crutches.

## 2021-03-12 NOTE — ED Triage Notes (Signed)
Pt had mechanical fall, slipped on mud, falling on her left knee yesterday at 9 pm. C/0 9/10 pain. No other injuries reported.

## 2021-03-12 NOTE — ED Provider Notes (Signed)
[facility] EMERGENCY DEPARTMENT Provider Note   CSN: [id number] Arrival date & time: 03/12/21  0403     History No chief complaint on file.   Linda Weber is a 35 y.o. female.  Patient to ED with c/o left knee pain since yesterday, getting worse over time. She was walking in wet grass and slipped falling with impact to the left knee. She has been unable to bear full weight since. No other injury.   The history is provided by the patient. No language interpreter was used.      Past Medical History:  Diagnosis Date   Abnormal Pap smear    Obesity     Patient Active Problem List   Diagnosis Date Noted   Encounter to establish care 02/13/2021   Hypertension 02/13/2021   History of PCOS 02/13/2021   Alcohol use 02/13/2021   Gastroesophageal reflux disease 02/13/2021   Mood and affect disturbance 07/24/2013   Obesity, unspecified 07/24/2013   Nicotine addiction 07/24/2013   Uses marijuana 07/24/2013   Oligomenorrhea 12/22/2011    No past surgical history on file.   OB History     Gravida  0   Para      Term      Preterm      AB      Living  0      SAB      IAB      Ectopic      Multiple      Live Births              Family History  Problem Relation Age of Onset   Diabetes Mother    Hypertension Mother    Breast cancer Maternal Grandmother     Social History   Tobacco Use   Smoking status: Every Day    Packs/day: 1.00    Years: 10.00    Pack years: 10.00    Types: Cigarettes    Start date: 07/25/2003   Smokeless tobacco: Never  Substance Use Topics   Alcohol use: Yes    Comment: occasionally   Drug use: No    Types: Marijuana, Cocaine    Comment: Hx cocaine & marijuana use    Home Medications Prior to Admission medications   Medication Sig Start Date End Date Taking? Authorizing Provider  acetaminophen (TYLENOL) 500 MG tablet Take 500-1,000 mg by mouth every 6 (six) hours as needed for moderate pain or  headache.    [provider]  Blood Pressure Monitor MISC USE TO CHECK BLOOD PRESSURE AS DIRECTED BY PHYSICIAN 02/13/21   Early, Sung Amabile, NP  diltiazem (CARDIZEM CD) 120 MG 24 hr capsule Take 1 capsule (120 mg total) by mouth daily. 02/13/21   Tollie Eth, NP  famotidine (PEPCID) 20 MG tablet Take 20 mg by mouth 2 (two) times daily.    [provider]  loratadine (CLARITIN) 10 MG tablet Take 1 tablet (10 mg total) by mouth daily. Patient not taking: Reported on 09/16/2015 04/23/15 05/29/19  Marlon Pel, PA-C  pantoprazole (PROTONIX) 20 MG tablet Take 2 tablets (40 mg total) by mouth daily. 01/29/19 05/29/19  Antony Madura, PA-C    Allergies    Benadryl [diphenhydramine hcl]  Review of Systems   Review of Systems  Constitutional:  Negative for diaphoresis.  Musculoskeletal:        See HPI.  Skin: Negative.  Negative for color change and wound.  Neurological: Negative.  Negative for numbness.  Physical Exam Updated Vital Signs BP (!) 150/111 (BP Location: Right Arm)   Pulse 98   Temp 98.6 F (37 C) (Oral)   Resp 20   SpO2 98%   Physical Exam Constitutional:      General: She is not in acute distress.    Appearance: She is well-developed. She is obese. She is not ill-appearing.  Cardiovascular:     Pulses: Normal pulses.  Pulmonary:     Effort: Pulmonary effort is normal.  Musculoskeletal:        General: Normal range of motion.     Cervical back: Normal range of motion.     Comments: No discoloration or deformity of the left knee. Joint appears stable. No calf or thigh pain on left.   Skin:    General: Skin is warm and dry.  Neurological:     Mental Status: She is alert and oriented to person, place, and time.    ED Results / Procedures / Treatments   Labs (all labs ordered are listed, but only abnormal results are displayed) Labs Reviewed - No data to display  EKG None  Radiology No results found. DG Knee Complete 4 Views Left  Result Date:  03/12/2021 CLINICAL DATA:  35 year old female status post fall yesterday. Continued pain. EXAM: LEFT KNEE - COMPLETE 4+ VIEW COMPARISON:  None. FINDINGS: Bone mineralization is within normal limits. Preserved joint spaces and alignment. There is mild medial and lateral compartment degenerative spurring. Intact patella. No joint effusion. Incidental fabella. No acute osseous abnormality identified. No discrete soft tissue injury. IMPRESSION: Mild medial and lateral compartment degenerative spurring. No acute fracture or dislocation identified about the left knee. Electronically Signed   By: Odessa Fleming M.D.   On: 03/12/2021 04:43    Procedures Procedures   Medications Ordered in ED Medications - No data to display  ED Course  I have reviewed the triage vital signs and the nursing notes.  Pertinent labs & imaging results that were available during my care of the patient were reviewed by me and considered in my medical decision making (see chart for details).    MDM Rules/Calculators/A&P                           Patient to ED with left knee injury as detailed in the HPI.   Imaging negative for fracture/dislocation. Patient updated on findings. Knee immobilizer and crutches provided. Ortho f/u prn.   Final Clinical Impression(s) / ED Diagnoses Final diagnoses:  None   Left knee sprain  Rx / DC Orders ED Discharge Orders     None        Elpidio Anis, PA-C 03/12/21 0541    Dione Booze, MD 03/12/21 [phone]

## 2021-04-08 ENCOUNTER — Encounter (HOSPITAL_BASED_OUTPATIENT_CLINIC_OR_DEPARTMENT_OTHER): Payer: Self-pay | Admitting: Nurse Practitioner

## 2021-04-13 ENCOUNTER — Ambulatory Visit (INDEPENDENT_AMBULATORY_CARE_PROVIDER_SITE_OTHER): Payer: 59 | Admitting: Nurse Practitioner

## 2021-04-13 ENCOUNTER — Other Ambulatory Visit (HOSPITAL_COMMUNITY)
Admission: RE | Admit: 2021-04-13 | Discharge: 2021-04-13 | Disposition: A | Discharge status: Home / Self Care | Payer: 59 | Source: Ambulatory Visit | Attending: Nurse Practitioner | Admitting: Nurse Practitioner

## 2021-04-13 ENCOUNTER — Encounter (HOSPITAL_BASED_OUTPATIENT_CLINIC_OR_DEPARTMENT_OTHER): Payer: Self-pay | Admitting: Nurse Practitioner

## 2021-04-13 ENCOUNTER — Other Ambulatory Visit: Payer: Self-pay

## 2021-04-13 VITALS — BP 138/95 | HR 86 | Ht 62.0 in | Wt 292.8 lb

## 2021-04-13 DIAGNOSIS — Z113 Encounter for screening for infections with a predominantly sexual mode of transmission: Secondary | ICD-10-CM

## 2021-04-13 DIAGNOSIS — J3089 Other allergic rhinitis: Secondary | ICD-10-CM

## 2021-04-13 DIAGNOSIS — J309 Allergic rhinitis, unspecified: Secondary | ICD-10-CM

## 2021-04-13 DIAGNOSIS — J0100 Acute maxillary sinusitis, unspecified: Secondary | ICD-10-CM

## 2021-04-13 DIAGNOSIS — F419 Anxiety disorder, unspecified: Secondary | ICD-10-CM

## 2021-04-13 DIAGNOSIS — Z0000 Encounter for general adult medical examination without abnormal findings: Secondary | ICD-10-CM

## 2021-04-13 DIAGNOSIS — L2082 Flexural eczema: Secondary | ICD-10-CM | POA: Diagnosis not present

## 2021-04-13 DIAGNOSIS — F32A Depression, unspecified: Secondary | ICD-10-CM

## 2021-04-13 DIAGNOSIS — Z8742 Personal history of other diseases of the female genital tract: Secondary | ICD-10-CM

## 2021-04-13 DIAGNOSIS — I10 Essential (primary) hypertension: Secondary | ICD-10-CM

## 2021-04-13 HISTORY — DX: Allergic rhinitis, unspecified: J30.9

## 2021-04-13 HISTORY — DX: Acute maxillary sinusitis, unspecified: J01.00

## 2021-04-13 MED ORDER — MOMETASONE FUROATE 0.1 % EX CREA: 1.0000 "application " | TOPICAL_CREAM | Freq: Every day | CUTANEOUS | 6 refills | Status: DC

## 2021-04-13 MED ORDER — MONTELUKAST SODIUM 10 MG PO TABS: 10.0000 mg | ORAL_TABLET | Freq: Every day | ORAL | 3 refills | Status: DC

## 2021-04-13 MED ORDER — AMOXICILLIN-POT CLAVULANATE 875-125 MG PO TABS: 1.0000 | ORAL_TABLET | Freq: Two times a day (BID) | ORAL | 0 refills | Status: DC

## 2021-04-13 MED ORDER — DILTIAZEM HCL ER COATED BEADS 120 MG PO CP24: 120.0000 mg | ORAL_CAPSULE | Freq: Every day | ORAL | 3 refills | Status: DC

## 2021-04-13 NOTE — Assessment & Plan Note (Signed)
Heavy, irregular menses.  Following with Dr. Hyacinth Meeker in October.  Recommend mutivitamin with iron in addition to vitamin D3 supplementation  Will check urine today for STI testing.

## 2021-04-13 NOTE — Assessment & Plan Note (Signed)
CPE today Labs UTD from last month Due for Pap- scheduled to see Dr. Hyacinth Meeker in October STI screen today Declines t-dap and flu vaccine at this time.

## 2021-04-13 NOTE — Assessment & Plan Note (Signed)
Blood pressure is better. Home results reported in the 120's/80's  Asymptomatic Will send in refill of medication today Labs in 6 months at f/u

## 2021-04-13 NOTE — Assessment & Plan Note (Signed)
>>  ASSESSMENT AND PLAN FOR ANXIETY AND DEPRESSION WRITTEN ON 04/13/2021  2:34 PM BY Michaila Kenney E, NP  Positive PHQ and GAD today She declines medication at this time, but does feel counseling would be helpful She has never been on medication or had counseling in the past Will send referral today No alarm symptoms present Recommend f/u if symptoms worsen or fail to improve.

## 2021-04-13 NOTE — Progress Notes (Signed)
BP (!) 138/95   Pulse 86   Ht '5\' 2"'  (1.575 m)   Wt 292 lb 12.8 oz (132.8 kg)   SpO2 100%   BMI 53.55 kg/m    Subjective:    Patient ID: Linda Weber, female    DOB: November 15, 1985, 35 y.o.   MRN: [id number]  HPI: Linda Weber is a 35 y.o. female presenting on 04/13/2021 for comprehensive medical examination.   Past Medical History:  Past Medical History:  Diagnosis Date   Abnormal Pap smear    Obesity    Medications:  Current Outpatient Medications on File Prior to Visit  Medication Sig   acetaminophen (TYLENOL) 500 MG tablet Take 500-1,000 mg by mouth every 6 (six) hours as needed for moderate pain or headache.   Blood Pressure Monitor MISC USE TO CHECK BLOOD PRESSURE AS DIRECTED BY PHYSICIAN   famotidine (PEPCID) 20 MG tablet Take 20 mg by mouth 2 (two) times daily.   [DISCONTINUED] loratadine (CLARITIN) 10 MG tablet Take 1 tablet (10 mg total) by mouth daily. (Patient not taking: Reported on 09/16/2015)   [DISCONTINUED] pantoprazole (PROTONIX) 20 MG tablet Take 2 tablets (40 mg total) by mouth daily.   No current facility-administered medications on file prior to visit.    She currently lives with: Alone with her puppies Interim Problems from her last visit:  allergies, eczema    She reports regular vision exams q1-5y: yes She reports regular dental exams q 49m N/A Her diet consists of:  "not good/not bad" "not consistent" "I try to drink enough water" She endorses exercise and/or activity of:  none She works at:  POptician, dispensingfor [employer]  She endorses ETOH use of  4 drinks per week She endorses nictoine use of  cigarettes 1/2ppd She denies illegal substance use   She is premenopausal She reports periods are very irregular with long periods of bleeding Current menopausal symptoms: no   She is  "sometimes"  sexually active with has sex with males She endorses concerns today about STI  She endorses concerns about skin changes today: rash and  pruritus She denies concerns about bowel changes today She denies concerns about bladder changes today  Depression Screen done today and results listed below:  Depression screen PHQ 2/9 02/13/2021  Decreased Interest 2  Down, Depressed, Hopeless 2  PHQ - 2 Score 4  Altered sleeping 2  Tired, decreased energy 3  Change in appetite 3  Feeling bad or failure about yourself  1  Trouble concentrating 1  Moving slowly or fidgety/restless 0  Suicidal thoughts 0  PHQ-9 Score 14  Difficult doing work/chores Not difficult at all   Anxiety Screening done GAD 7 : Generalized Anxiety Score 02/13/2021  Nervous, Anxious, on Edge 2  Control/stop worrying 1  Worry too much - different things 1  Trouble relaxing 2  Restless 1  Easily annoyed or irritable 2  Afraid - awful might happen 1  Total GAD 7 Score 10  Anxiety Difficulty Somewhat difficult    She has a history of falls. I did complete a risk assessment for falls. A plan of care for falls was documented. Fall Risk  02/13/2021  Falls in the past year? 1  Number falls in past yr: 0  Injury with Fall? 1  Risk for fall due to : History of fall(s)  Follow up Falls evaluation completed   Surgical History:  History reviewed. No pertinent surgical history.  Allergies:  Allergies  Allergen  Reactions   Benadryl [Diphenhydramine Hcl] Hives    Social History:  Social History   Socioeconomic History   Marital status: Single    Spouse name: Not on file   Number of children: Not on file   Years of education: Not on file   Highest education level: Not on file  Occupational History   Not on file  Tobacco Use   Smoking status: Every Day    Packs/day: 1.00    Years: 10.00    Pack years: 10.00    Types: Cigarettes    Start date: 07/25/2003   Smokeless tobacco: Never  Substance and Sexual Activity   Alcohol use: Yes    Comment: occasionally   Drug use: No    Types: Marijuana, Cocaine    Comment: Hx cocaine & marijuana use   Sexual  activity: Yes    Birth control/protection: None  Other Topics Concern   Not on file  Social History Narrative   Not on file   Social Determinants of Health   Financial Resource Strain: Not on file  Food Insecurity: Not on file  Transportation Needs: Not on file  Physical Activity: Not on file  Stress: Not on file  Social Connections: Not on file  Intimate Partner Violence: Not on file   Social History   Tobacco Use  Smoking Status Every Day   Packs/day: 1.00   Years: 10.00   Pack years: 10.00   Types: Cigarettes   Start date: 07/25/2003  Smokeless Tobacco Never   Social History   Substance and Sexual Activity  Alcohol Use Yes   Comment: occasionally    Family History:  Family History  Problem Relation Age of Onset   Diabetes Mother    Hypertension Mother    Breast cancer Maternal Grandmother     Past medical history, surgical history, medications, allergies, family history and social history reviewed with patient today and changes made to appropriate areas of the chart.   All ROS negative except what is listed above and in the HPI.      Objective:    BP (!) 138/95   Pulse 86   Ht '5\' 2"'  (1.575 m)   Wt 292 lb 12.8 oz (132.8 kg)   SpO2 100%   BMI 53.55 kg/m   Wt Readings from Last 3 Encounters:  04/13/21 292 lb 12.8 oz (132.8 kg)  03/12/21 280 lb (127 kg)  02/27/21 297 lb (134.7 kg)    Physical Exam Vitals and nursing note reviewed.  Constitutional:      General: She is not in acute distress.    Appearance: Normal appearance.  HENT:     Head: Normocephalic and atraumatic.     Right Ear: Hearing, tympanic membrane, ear canal and external ear normal.     Left Ear: Hearing, tympanic membrane, ear canal and external ear normal.     Nose: Congestion and rhinorrhea present.     Right Sinus: Maxillary sinus tenderness present. No frontal sinus tenderness.     Left Sinus: Maxillary sinus tenderness present. No frontal sinus tenderness.     Mouth/Throat:      Lips: Pink.     Mouth: Mucous membranes are moist.     Pharynx: Oropharynx is clear. Posterior oropharyngeal erythema present. No uvula swelling.     Tonsils: No tonsillar exudate.  Eyes:     General: Lids are normal. Vision grossly intact.     Extraocular Movements: Extraocular movements intact.     Conjunctiva/sclera: Conjunctivae normal.  Pupils: Pupils are equal, round, and reactive to light.     Funduscopic exam:    Right eye: Red reflex present.        Left eye: Red reflex present.    Visual Fields: Right eye visual fields normal and left eye visual fields normal.  Neck:     Thyroid: No thyromegaly.     Vascular: No carotid bruit.  Cardiovascular:     Rate and Rhythm: Normal rate and regular rhythm.     Chest Wall: PMI is not displaced.     Pulses: Normal pulses.          Dorsalis pedis pulses are 2+ on the right side and 2+ on the left side.       Posterior tibial pulses are 2+ on the right side and 2+ on the left side.     Heart sounds: Normal heart sounds. No murmur heard. Pulmonary:     Effort: Pulmonary effort is normal. No respiratory distress.     Breath sounds: Normal breath sounds.  Abdominal:     General: Bowel sounds are normal. There is no distension.     Palpations: Abdomen is soft. There is no hepatomegaly, splenomegaly or mass.     Tenderness: There is no abdominal tenderness. There is no right CVA tenderness, left CVA tenderness, guarding or rebound.  Musculoskeletal:        General: Normal range of motion.     Cervical back: Full passive range of motion without pain and neck supple. No tenderness.     Right lower leg: No edema.     Left lower leg: No edema.  Feet:     Left foot:     Toenail Condition: Left toenails are normal.  Lymphadenopathy:     Cervical: No cervical adenopathy.     Upper Body:     Right upper body: No supraclavicular adenopathy.     Left upper body: No supraclavicular adenopathy.  Skin:    General: Skin is warm and dry.      Capillary Refill: Capillary refill takes less than 2 seconds.     Findings: Rash present. Rash is scaling.     Nails: There is no clubbing.     Comments: Scaly erythremic plaques scattered over abdomen  Neurological:     General: No focal deficit present.     Mental Status: She is alert and oriented to person, place, and time.     GCS: GCS eye subscore is 4. GCS verbal subscore is 5. GCS motor subscore is 6.     Cranial Nerves: Cranial nerves are intact.     Sensory: Sensation is intact.     Motor: Motor function is intact.     Coordination: Coordination is intact.     Gait: Gait is intact.     Deep Tendon Reflexes: Reflexes are normal and symmetric.  Psychiatric:        Attention and Perception: Attention normal.        Mood and Affect: Mood normal.        Speech: Speech normal.        Behavior: Behavior normal. Behavior is cooperative.        Thought Content: Thought content normal.        Cognition and Memory: Cognition and memory normal.        Judgment: Judgment normal.    Results for orders placed or performed in visit on 02/13/21  CBC with Differential/Platelet  Result Value Ref Range  WBC 6.1 3.4 - 10.8 x10E3/uL   RBC 6.08 (H) 3.77 - 5.28 x10E6/uL   Hemoglobin 14.1 11.1 - 15.9 g/dL   Hematocrit 43.8 34.0 - 46.6 %   MCV 72 (L) 79 - 97 fL   MCH 23.2 (L) 26.6 - 33.0 pg   MCHC 32.2 31.5 - 35.7 g/dL   RDW 16.0 (H) 11.7 - 15.4 %   Platelets 299 150 - 450 x10E3/uL   Neutrophils 61 Not Estab. %   Lymphs 28 Not Estab. %   Monocytes 8 Not Estab. %   Eos 2 Not Estab. %   Basos 1 Not Estab. %   Neutrophils Absolute 3.7 1.4 - 7.0 x10E3/uL   Lymphocytes Absolute 1.7 0.7 - 3.1 x10E3/uL   Monocytes Absolute 0.5 0.1 - 0.9 x10E3/uL   EOS (ABSOLUTE) 0.1 0.0 - 0.4 x10E3/uL   Basophils Absolute 0.1 0.0 - 0.2 x10E3/uL   Immature Granulocytes 0 Not Estab. %   Immature Grans (Abs) 0.0 0.0 - 0.1 x10E3/uL  TSH  Result Value Ref Range   TSH 1.170 0.450 - 4.500 uIU/mL  VITAMIN D  25 Hydroxy (Vit-D Deficiency, Fractures)  Result Value Ref Range   Vit D, 25-Hydroxy 21.5 (L) 30.0 - 100.0 ng/mL  Comprehensive metabolic panel  Result Value Ref Range   Glucose 101 (H) 65 - 99 mg/dL   BUN 9 6 - 20 mg/dL   Creatinine, Ser 0.84 0.57 - 1.00 mg/dL   eGFR 93 >59 mL/min/1.73   BUN/Creatinine Ratio 11 9 - 23   Sodium 140 134 - 144 mmol/L   Potassium 3.6 3.5 - 5.2 mmol/L   Chloride 102 96 - 106 mmol/L   CO2 24 20 - 29 mmol/L   Calcium 9.1 8.7 - 10.2 mg/dL   Total Protein 7.7 6.0 - 8.5 g/dL   Albumin 4.2 3.8 - 4.8 g/dL   Globulin, Total 3.5 1.5 - 4.5 g/dL   Albumin/Globulin Ratio 1.2 1.2 - 2.2   Bilirubin Total 0.4 0.0 - 1.2 mg/dL   Alkaline Phosphatase 88 44 - 121 IU/L   AST 30 0 - 40 IU/L   ALT 23 0 - 32 IU/L  Lipid panel  Result Value Ref Range   Cholesterol, Total 165 100 - 199 mg/dL   Triglycerides 159 (H) 0 - 149 mg/dL   HDL 42 >39 mg/dL   VLDL Cholesterol Cal 28 5 - 40 mg/dL   LDL Chol Calc (NIH) 95 0 - 99 mg/dL   Chol/HDL Ratio 3.9 0.0 - 4.4 ratio  PT and PTT  Result Value Ref Range   INR 1.0 0.9 - 1.2   Prothrombin Time 10.5 9.1 - 12.0 sec   aPTT 27 24 - 33 sec  Ct, Ng, Mycoplasmas NAA, Urine  Result Value Ref Range   Mycoplasma genitalium NAA Negative Negative   Mycoplasma hominis NAA Positive (A) Negative   Ureaplasma spp NAA Positive (A) Negative   Chlamydia trachomatis, NAA Negative Negative   Neisseria gonorrhoeae, NAA Negative Negative  Iron, TIBC and Ferritin Panel  Result Value Ref Range   Total Iron Binding Capacity 401 250 - 450 ug/dL   UIBC 336 131 - 425 ug/dL   Iron 65 27 - 159 ug/dL   Iron Saturation 16 15 - 55 %   Ferritin 43 15 - 150 ng/mL  B12 and Folate Panel  Result Value Ref Range   Vitamin B-12 313 232 - 1,245 pg/mL   Folate 5.0 >3.0 ng/mL  Specimen status report  Result Value Ref  Range   specimen status report Comment   POCT glycosylated hemoglobin (Hb A1C)  Result Value Ref Range   Hemoglobin A1C 5.5 4.0 - 5.6 %    HbA1c POC (<> result, manual entry)     HbA1c, POC (prediabetic range)     HbA1c, POC (controlled diabetic range)        Assessment & Plan:   Problem List Items Addressed This Visit     Encounter for annual physical exam - Primary    CPE today Labs UTD from last month Due for Pap- scheduled to see Dr. Sabra Heck in October STI screen today Declines t-dap and flu vaccine at this time.       Hypertension    Blood pressure is better. Home results reported in the 120's/80's  Asymptomatic Will send in refill of medication today Labs in 6 months at f/u      Relevant Medications   diltiazem (CARDIZEM CD) 120 MG 24 hr capsule   History of PCOS    Heavy, irregular menses.  Following with Dr. Sabra Heck in October.  Recommend mutivitamin with iron in addition to vitamin D3 supplementation  Will check urine today for STI testing.      Allergic rhinitis    Symptoms and presentation consistent with allergic rhinitis Additional concerns with eczema outbreak at this time Will treat with montelukast as zyrtec has not been effective.  F/U if symptoms worsen or fail to improve      Relevant Medications   montelukast (SINGULAIR) 10 MG tablet   Flexural eczema    Scaly pruritic rash to abdomen and flexural surfaces.  Start montelukast PO and mometasone once daily topically for treatment.  Avoid harsh perfumes or soaps.  F/U if symptoms worsen or fail to improve.       Relevant Medications   mometasone (ELOCON) 0.1 % cream   Anxiety and depression    Positive PHQ and GAD today She declines medication at this time, but does feel counseling would be helpful She has never been on medication or had counseling in the past Will send referral today No alarm symptoms present Recommend f/u if symptoms worsen or fail to improve.        Relevant Orders   Ambulatory referral to Psychology   Acute non-recurrent maxillary sinusitis    Allergic rhinitis with sinus pain and pressure for about 8  days Discussed with patient the option to begin antibiotic therapy She will start the montelukast and if no change in symptoms in the next 2 days, she will start the antibiotic F/U if sx worsen or fail to improve.       Relevant Medications   amoxicillin-clavulanate (AUGMENTIN) 875-125 MG tablet   Other Visit Diagnoses     Routine screening for STI (sexually transmitted infection)       Relevant Orders   Cytology (oral, anal, urethral) ancillary only        Follow up plan: Return in about 6 months (around 10/13/2021) for BP and Labs.   LABORATORY TESTING:  - Pap smear: done elsewhere - STI testing: done today  IMMUNIZATIONS:   - Tdap: Tetanus vaccination status reviewed: "probably more than 10 years" I don't want to have this done today. - Influenza: Postponed to flu season - Pneumovax: Not applicable - Prevnar: Not applicable - HPV: Not applicable - Zostavax vaccine: Not applicable  SCREENING: -Mammogram: Not applicable  - Colonoscopy: Not applicable  - Bone Density: Not applicable  -Hearing Test: Not applicable  -Spirometry: Not applicable  PATIENT COUNSELING:   For all adult patients, I recommend   A well balanced diet low in saturated fats, cholesterol, and moderation in carbohydrates.   This can be as simple as monitoring portion sizes and cutting back on sugary beverages such as soda and  juice to start with.    Daily water consumption of at least 64 ounces.  Physical activity at least 180 minutes per week, if just starting out.   This can be as simple as taking the stairs instead of the elevator and walking 2-3 laps around the office  purposefully every day.   STD protection, partner selection, and regular testing if high risk.  Limited consumption of alcoholic beverages if alcohol is consumed.  For women, I recommend no more than 7 alcoholic beverages per week, spread out throughout the week.  Avoid "binge" drinking or consuming large quantities of  alcohol in one setting.   Please let me know if you feel you may need help with reduction or quitting alcohol consumption.   Avoidance of nicotine, if used.  Please let me know if you feel you may need help with reduction or quitting nicotine use.   Daily mental health attention.  This can be in the form of 5 minute daily meditation, prayer, journaling, yoga, reflection, etc.   Purposeful attention to your emotions and mental state can significantly improve your overall wellbeing  and  Health.  Please know that I am here to help you with all of your health care goals and am happy to work with you to find a solution that works best for you.  The greatest advice I have received with any changes in life are to take it one step at a time, that even means if all you can focus on is the next 60 seconds, then do that and celebrate your victories.  With any changes in life, you will have set backs, and that is OK. The important thing to remember is, if you have a set back, it is not a failure, it is an opportunity to try again!  Health Maintenance Recommendations Screening Testing Mammogram Every 1 -2 years based on history and risk factors Starting at age 60 Pap Smear Ages 21-39 every 3 years Ages 13-65 every 5 years with HPV testing More frequent testing may be required based on results and history Colon Cancer Screening Every 1-10 years based on test performed, risk factors, and history Starting at age 39 Bone Density Screening Every 2-10 years based on history Starting at age 23 for women Recommendations for men differ based on medication usage, history, and risk factors AAA Screening One time ultrasound Men 14-78 years old who have every smoked Lung Cancer Screening Low Dose Lung CT every 12 months Age 8-80 years with a 30 pack-year smoking history who still smoke or who have quit within the last 15 years  Screening Labs Routine  Labs: Complete Blood Count (CBC), Complete  Metabolic Panel (CMP), Cholesterol (Lipid Panel) Every 6-12 months based on history and medications May be recommended more frequently based on current conditions or previous results Hemoglobin A1c Lab Every 3-12 months based on history and previous results Starting at age 17 or earlier with diagnosis of diabetes, high cholesterol, BMI >26, and/or risk factors Frequent monitoring for patients with diabetes to ensure blood sugar control Thyroid Panel (TSH w/ T3 & T4) Every 6 months based on history, symptoms, and risk factors May be repeated more often if on medication HIV One time testing for  all patients 14 and older May be repeated more frequently for patients with increased risk factors or exposure Hepatitis C One time testing for all patients 52 and older May be repeated more frequently for patients with increased risk factors or exposure Gonorrhea, Chlamydia Every 12 months for all sexually active persons 13-24 years Additional monitoring may be recommended for those who are considered high risk or who have symptoms PSA Men 63-33 years old with risk factors Additional screening may be recommended from age 1-69 based on risk factors, symptoms, and history  Vaccine Recommendations Tetanus Booster All adults every 10 years Flu Vaccine All patients 6 months and older every year COVID Vaccine All patients 12 years and older Initial dosing with booster May recommend additional booster based on age and health history HPV Vaccine 2 doses all patients age 64-26 Dosing may be considered for patients over 26 Shingles Vaccine (Shingrix) 2 doses all adults 43 years and older Pneumonia (Pneumovax 23) All adults 76 years and older May recommend earlier dosing based on health history Pneumonia (Prevnar 41) All adults 71 years and older Dosed 1 year after Pneumovax 23  Additional Screening, Testing, and Vaccinations may be recommended on an individualized basis based on family  history, health history, risk factors, and/or exposure.      NEXT PREVENTATIVE PHYSICAL DUE IN 1 YEAR. Return in about 6 months (around 10/13/2021) for BP and Labs.

## 2021-04-13 NOTE — Patient Instructions (Addendum)
Recommendations: We will check your urine today and make sure it looks ok. We won't need to do labs this time since we just did them.  We will follow-up in 6 months for your BP and plan to get labs then  Hypertension, Adult Hypertension is another name for high blood pressure. High blood pressure forces your heart to work harder to pump blood. This can cause problems overtime. There are two numbers in a blood pressure reading. There is a top number (systolic) over a bottom number (diastolic). It is best to have a blood pressure that is below 120/80. Healthy choicescan help lower your blood pressure, or you may need medicine to help lower it. What are the causes? The cause of this condition is not known. Some conditions may be related tohigh blood pressure. What increases the risk? Smoking. Having type 2 diabetes mellitus, high cholesterol, or both. Not getting enough exercise or physical activity. Being overweight. Having too much fat, sugar, calories, or salt (sodium) in your diet. Drinking too much alcohol. Having long-term (chronic) kidney disease. Having a family history of high blood pressure. Age. Risk increases with age. Race. You may be at higher risk if you are African American. Gender. Men are at higher risk than women before age 2. After age 47, women are at higher risk than men. Having obstructive sleep apnea. Stress. What are the signs or symptoms? High blood pressure may not cause symptoms. Very high blood pressure (hypertensive crisis) may cause: Headache. Feelings of worry or nervousness (anxiety). Shortness of breath. Nosebleed. A feeling of being sick to your stomach (nausea). Throwing up (vomiting). Changes in how you see. Very bad chest pain. Seizures. How is this treated? This condition is treated by making healthy lifestyle changes, such as: Eating healthy foods. Exercising more. Drinking less alcohol. Your health care provider may prescribe medicine if  lifestyle changes are not enough to get your blood pressure under control, and if: Your top number is above 130. Your bottom number is above 80. Your personal target blood pressure may vary. Follow these instructions at home: Eating and drinking  If told, follow the DASH eating plan. To follow this plan: Fill one half of your plate at each meal with fruits and vegetables. Fill one fourth of your plate at each meal with whole grains. Whole grains include whole-wheat pasta, brown rice, and whole-grain bread. Eat or drink low-fat dairy products, such as skim milk or low-fat yogurt. Fill one fourth of your plate at each meal with low-fat (lean) proteins. Low-fat proteins include fish, chicken without skin, eggs, beans, and tofu. Avoid fatty meat, cured and processed meat, or chicken with skin. Avoid pre-made or processed food. Eat less than 1,500 mg of salt each day. Do not drink alcohol if: Your doctor tells you not to drink. You are pregnant, may be pregnant, or are planning to become pregnant. If you drink alcohol: Limit how much you use to: 0-1 drink a day for women. 0-2 drinks a day for men. Be aware of how much alcohol is in your drink. In the [place] one drink equals one 12 oz bottle of beer (355 mL), one 5 oz glass of wine (148 mL), or one 1 oz glass of hard liquor (44 mL).  Lifestyle  Work with your doctor to stay at a healthy weight or to lose weight. Ask your doctor what the best weight is for you. Get at least 30 minutes of exercise most days of the week. This may include walking,  swimming, or biking. Get at least 30 minutes of exercise that strengthens your muscles (resistance exercise) at least 3 days a week. This may include lifting weights or doing Pilates. Do not use any products that contain nicotine or tobacco, such as cigarettes, e-cigarettes, and chewing tobacco. If you need help quitting, ask your doctor. Check your blood pressure at home as told by your doctor. Keep  all follow-up visits as told by your doctor. This is important.  Medicines Take over-the-counter and prescription medicines only as told by your doctor. Follow directions carefully. Do not skip doses of blood pressure medicine. The medicine does not work as well if you skip doses. Skipping doses also puts you at risk for problems. Ask your doctor about side effects or reactions to medicines that you should watch for. Contact a doctor if you: Think you are having a reaction to the medicine you are taking. Have headaches that keep coming back (recurring). Feel dizzy. Have swelling in your ankles. Have trouble with your vision. Get help right away if you: Get a very bad headache. Start to feel mixed up (confused). Feel weak or numb. Feel faint. Have very bad pain in your: Chest. Belly (abdomen). Throw up more than once. Have trouble breathing. Summary Hypertension is another name for high blood pressure. High blood pressure forces your heart to work harder to pump blood. For most people, a normal blood pressure is less than 120/80. Making healthy choices can help lower blood pressure. If your blood pressure does not get lower with healthy choices, you may need to take medicine. This information is not intended to replace advice given to you by your health care provider. Make sure you discuss any questions you have with your healthcare provider. Document Revised: 04/12/2018 Document Reviewed: 04/12/2018 Elsevier Patient Education  2022 Else[employer] Inc.  Smoking Tobacco Information, Adult Smoking tobacco can be harmful to your health. Tobacco contains a poisonous (toxic), colorless chemical called nicotine. Nicotine is addictive. It changes the brain and can make it hard to stop smoking. Tobacco also has other toxicchemicals that can hurt your body and raise your risk of many cancers. How can smoking tobacco affect me? Smoking tobacco puts you at risk for: Cancer. Smoking is most commonly  associated with lung cancer, but can also lead to cancer in other parts of the body. Chronic obstructive pulmonary disease (COPD). This is a long-term lung condition that makes it hard to breathe. It also gets worse over time. High blood pressure (hypertension), heart disease, stroke, or heart attack. Lung infections, such as pneumonia. Cataracts. This is when the lenses in the eyes become clouded. Digestive problems. This may include peptic ulcers, heartburn, and gastroesophageal reflux disease (GERD). Oral health problems, such as gum disease and tooth loss. Loss of taste and smell. Smoking can affect your appearance by causing: Wrinkles. Yellow or stained teeth, fingers, and fingernails. Smoking tobacco can also affect your social life, because: It may be challenging to find places to smoke when away from home. Many workplaces, [employer], hotels, and public places are tobacco-free. Smoking is expensive. This is due to the cost of tobacco and the long-term costs of treating health problems from smoking. Secondhand smoke may affect those around you. Secondhand smoke can cause lung cancer, breathing problems, and heart disease. Children of smokers have a higher risk for: Sudden infant death syndrome (SIDS). Ear infections. Lung infections. If you currently smoke tobacco, quitting now can help you: Lead a longer and healthier life. Look, smell, breathe, and  feel better over time. Save money. Protect others from the harms of secondhand smoke. What actions can I take to prevent health problems? Quit smoking  Do not start smoking. Quit if you already do. Make a plan to quit smoking and commit to it. Look for programs to help you and ask your health care provider for recommendations and ideas. Set a date and write down all the reasons you want to quit. Let your friends and family know you are quitting so they can help and support you. Consider finding friends who also want to quit. It can  be easier to quit with someone else, so that you can support each other. Talk with your health care provider about using nicotine replacement medicines to help you quit, such as gum, lozenges, patches, sprays, or pills. Do not replace cigarette smoking with electronic cigarettes, which are commonly called e-cigarettes. The safety of e-cigarettes is not known, and some may contain harmful chemicals. If you try to quit but return to smoking, stay positive. It is common to slip up when you first quit, so take it one day at a time. Be prepared for cravings. When you feel the urge to smoke, chew gum or suck on hard candy.  Lifestyle Stay busy and take care of your body. Drink enough fluid to keep your urine pale yellow. Get plenty of exercise and eat a healthy diet. This can help prevent weight gain after quitting. Monitor your eating habits. Quitting smoking can cause you to have a larger appetite than when you smoke. Find ways to relax. Go out with friends or family to a movie or a restaurant where people do not smoke. Ask your health care provider about having regular tests (screenings) to check for cancer. This may include blood tests, imaging tests, and other tests. Find ways to manage your stress, such as meditation, yoga, or exercise. Where to find support To get support to quit smoking, consider: Asking your health care provider for more information and resources. Taking classes to learn more about quitting smoking. Looking for local organizations that offer resources about quitting smoking. Joining a support group for people who want to quit smoking in your local community. Calling the smokefree.gov counselor helpline: 1-800-Quit-Now 216-422-7244(1-704-077-3988) Where to find more information You may find more information about quitting smoking from: HelpGuide.org: www.helpguide.org BankR[employer]uySmokefree.gov: smokefree.gov American Lung Association: www.lung.org Contact a health care provider if you: Have  problems breathing. Notice that your lips, nose, or fingers turn blue. Have chest pain. Are coughing up blood. Feel faint or you pass out. Have other health changes that cause you to worry. Summary Smoking tobacco can negatively affect your health, the health of those around you, your finances, and your social life. Do not start smoking. Quit if you already do. If you need help quitting, ask your health care provider. Think about joining a support group for people who want to quit smoking in your local community. There are many effective programs that will help you to quit this behavior. This information is not intended to replace advice given to you by your health care provider. Make sure you discuss any questions you have with your healthcare provider. Document Revised: 06/24/2020 Document Reviewed: 06/24/2020 Elsevier Patient Education  2022 Elsevier Inc.  Steps to Quit Smoking Smoking tobacco is the leading cause of preventable death. It can affect almost every organ in the body. Smoking puts you and those around you at risk for developing many serious chronic diseases. Quitting smoking can be difficult,  but it is one of the best things that you can do for your health. It is nevertoo late to quit. How do I get ready to quit? When you decide to quit smoking, create a plan to help you succeed. Before you quit: Pick a date to quit. Set a date within the next 2 weeks to give you time to prepare. Write down the reasons why you are quitting. Keep this list in places where you will see it often. Tell your family, friends, and co-workers that you are quitting. Support from your loved ones can make quitting easier. Talk with your health care provider about your options for quitting smoking. Find out what treatment options are covered by your health insurance. Identify people, places, things, and activities that make you want to smoke (triggers). Avoid them. What first steps can I take to quit  smoking? Throw away all cigarettes at home, at work, and in your car. Throw away smoking accessories, such as Set designer. Clean your car. Make sure to empty the ashtray. Clean your home, including curtains and carpets. What strategies can I use to quit smoking? Talk with your health care provider about combining strategies, such as taking medicines while you are also receiving in-person counseling. Using these two strategies together makes you more likely to succeed in quitting than if you used either strategy on its own. If you are pregnant or breastfeeding, talk with your health care provider about finding counseling or other support strategies to quit smoking. Do not take medicine to help you quit smoking unless your health care provider tells you to do so. To quit smoking: Quit right away Quit smoking completely, instead of gradually reducing how much you smoke over a period of time. Research shows that stopping smoking right away is more successful than gradually quitting. Attend in-person counseling to help you build problem-solving skills. You are more likely to succeed in quitting if you attend counseling sessions regularly. Even short sessions of 10 minutes can be effective. Take medicine You may take medicines to help you quit smoking. Some medicines require a prescription and some you can purchase over-the-counter. Medicines may have nicotine in them to replace the nicotine in cigarettes. Medicines may: Help to stop cravings. Help to relieve withdrawal symptoms. Your health care provider may recommend: Nicotine patches, gum, or lozenges. Nicotine inhalers or sprays. Non-nicotine medicine that is taken by mouth. Find resources Find resources and support systems that can help you to quit smoking and remain smoke-free after you quit. These resources are most helpful when you use them often. They include: Online chats with a Veterinary surgeon. Telephone quitlines. Printed Chief Executive Officer. Support groups or group counseling. Text messaging programs. Mobile phone apps or applications. Use apps that can help you stick to your quit plan by providing reminders, tips, and encouragement. There are many free apps for mobile devices as well as websites. Examples include Quit Guide from the [employer] and smokefree.gov What things can I do to make it easier to quit?  Reach out to your family and friends for support and encouragement. Call telephone quitlines (1-800-QUIT-NOW), reach out to support groups, or work with a counselor for support. Ask people who smoke to avoid smoking around you. Avoid places that trigger you to smoke, such as bars, parties, or smoke-break areas at work. Spend time with people who do not smoke. Lessen the stress in your life. Stress can be a smoking trigger for some people. To lessen stress, try: Exercising regularly. Doing deep-breathing  exercises. Doing yoga. Meditating. Performing a body scan. This involves closing your eyes, scanning your body from head to toe, and noticing which parts of your body are particularly tense. Try to relax the muscles in those areas. How will I feel when I quit smoking? Day 1 to 3 weeks Within the first 24 hours of quitting smoking, you may start to feel withdrawal symptoms. These symptoms are usually most noticeable 2-3 days after quitting, but they usually do not last for more than 2-3 weeks. You may experience these symptoms: Mood swings. Restlessness, anxiety, or irritability. Trouble concentrating. Dizziness. Strong cravings for sugary foods and nicotine. Mild weight gain. Constipation. Nausea. Coughing or a sore throat. Changes in how the medicines that you take for unrelated issues work in your body. Depression. Trouble sleeping (insomnia). Week 3 and afterward After the first 2-3 weeks of quitting, you may start to notice more positive results, such as: Improved sense of smell and taste. Decreased  coughing and sore throat. Slower heart rate. Lower blood pressure. Clearer skin. The ability to breathe more easily. Fewer sick days. Quitting smoking can be very challenging. Do not get discouraged if you are not successful the first time. Some people need to make many attempts to quit before they achieve long-term success. Do your best to stick to your quit plan, and talk with your health care provider if you haveany questions or concerns. Summary Smoking tobacco is the leading cause of preventable death. Quitting smoking is one of the best things that you can do for your health. When you decide to quit smoking, create a plan to help you succeed. Quit smoking right away, not slowly over a period of time. When you start quitting, seek help from your health care provider, family, or friends. This information is not intended to replace advice given to you by your health care provider. Make sure you discuss any questions you have with your healthcare provider. Document Revised: 04/27/2019 Document Reviewed: 10/21/2018 Elsevier Patient Education  2022 [employer].

## 2021-04-13 NOTE — Assessment & Plan Note (Signed)
Positive PHQ and GAD today She declines medication at this time, but does feel counseling would be helpful She has never been on medication or had counseling in the past Will send referral today No alarm symptoms present Recommend f/u if symptoms worsen or fail to improve.

## 2021-04-13 NOTE — Assessment & Plan Note (Signed)
Symptoms and presentation consistent with allergic rhinitis Additional concerns with eczema outbreak at this time Will treat with montelukast as zyrtec has not been effective.  F/U if symptoms worsen or fail to improve

## 2021-04-13 NOTE — Assessment & Plan Note (Signed)
Allergic rhinitis with sinus pain and pressure for about 8 days Discussed with patient the option to begin antibiotic therapy She will start the montelukast and if no change in symptoms in the next 2 days, she will start the antibiotic F/U if sx worsen or fail to improve.

## 2021-04-13 NOTE — Assessment & Plan Note (Signed)
Scaly pruritic rash to abdomen and flexural surfaces.  Start montelukast PO and mometasone once daily topically for treatment.  Avoid harsh perfumes or soaps.  F/U if symptoms worsen or fail to improve.

## 2021-04-14 LAB — CYTOLOGY, (ORAL, ANAL, URETHRAL) ANCILLARY ONLY
Chlamydia: NEGATIVE
Comment: NEGATIVE
Comment: NEGATIVE
Comment: NORMAL
Neisseria Gonorrhea: NEGATIVE
Trichomonas: POSITIVE — AB

## 2021-04-17 ENCOUNTER — Telehealth (HOSPITAL_BASED_OUTPATIENT_CLINIC_OR_DEPARTMENT_OTHER): Payer: Self-pay

## 2021-04-17 DIAGNOSIS — Z113 Encounter for screening for infections with a predominantly sexual mode of transmission: Secondary | ICD-10-CM

## 2021-04-17 MED ORDER — METRONIDAZOLE 500 MG PO TABS: 500.0000 mg | ORAL_TABLET | Freq: Two times a day (BID) | ORAL | 0 refills | Status: AC

## 2021-04-17 NOTE — Telephone Encounter (Signed)
Called patient to discuss her STI results. Patient is aware and stated she viewed it on MyChart. Patient is agreeable with recommendation. Instructed patient to contact the office with questions and concerns. Order placed for medication.

## 2021-04-17 NOTE — Telephone Encounter (Signed)
-----   Message from Tollie Eth, NP sent at 04/17/2021  1:46 AM EDT ----- Please let patient know her Linda Weber is positive. Ok to send treatment with Metronidazole 500mg  twice a day for 7 days. She will need to come back for retest in 2 weeks- ok for nurse visit. Please let her know her partner(s) will need to be treated as well.  She must refrain from all sexual activity until treatment is completed to avoid transmission.  Please let her know she cannot consume alcohol with this treatment as it can cause significant side effects.  This is not a currently reportable STI. No report to health dept needed.

## 2021-04-30 ENCOUNTER — Ambulatory Visit: Payer: 59 | Admitting: Psychology

## 2021-06-03 ENCOUNTER — Encounter (HOSPITAL_BASED_OUTPATIENT_CLINIC_OR_DEPARTMENT_OTHER): Payer: Self-pay | Admitting: Nurse Practitioner

## 2021-06-11 ENCOUNTER — Ambulatory Visit (HOSPITAL_BASED_OUTPATIENT_CLINIC_OR_DEPARTMENT_OTHER): Payer: 59 | Admitting: Obstetrics & Gynecology

## 2021-09-09 ENCOUNTER — Ambulatory Visit (HOSPITAL_BASED_OUTPATIENT_CLINIC_OR_DEPARTMENT_OTHER): Payer: 59 | Admitting: Obstetrics & Gynecology

## 2021-10-13 ENCOUNTER — Other Ambulatory Visit (HOSPITAL_BASED_OUTPATIENT_CLINIC_OR_DEPARTMENT_OTHER): Payer: Self-pay

## 2021-10-13 ENCOUNTER — Encounter (HOSPITAL_BASED_OUTPATIENT_CLINIC_OR_DEPARTMENT_OTHER): Payer: Self-pay | Admitting: Nurse Practitioner

## 2021-10-13 ENCOUNTER — Other Ambulatory Visit: Payer: Self-pay

## 2021-10-13 ENCOUNTER — Ambulatory Visit (INDEPENDENT_AMBULATORY_CARE_PROVIDER_SITE_OTHER): Payer: Self-pay | Admitting: Nurse Practitioner

## 2021-10-13 VITALS — BP 117/72 | HR 99 | Ht 62.0 in | Wt 296.0 lb

## 2021-10-13 DIAGNOSIS — J0140 Acute pansinusitis, unspecified: Secondary | ICD-10-CM

## 2021-10-13 DIAGNOSIS — J0100 Acute maxillary sinusitis, unspecified: Secondary | ICD-10-CM

## 2021-10-13 DIAGNOSIS — I10 Essential (primary) hypertension: Secondary | ICD-10-CM

## 2021-10-13 DIAGNOSIS — Z0100 Encounter for examination of eyes and vision without abnormal findings: Secondary | ICD-10-CM

## 2021-10-13 LAB — COMPREHENSIVE METABOLIC PANEL
ALT: 22 IU/L (ref 0–32)
AST: 27 IU/L (ref 0–40)
Albumin/Globulin Ratio: 1.4 (ref 1.2–2.2)
Albumin: 4.3 g/dL (ref 3.8–4.8)
Alkaline Phosphatase: 86 IU/L (ref 44–121)
BUN/Creatinine Ratio: 11 (ref 9–23)
BUN: 10 mg/dL (ref 6–20)
Bilirubin Total: 0.5 mg/dL (ref 0.0–1.2)
CO2: 25 mmol/L (ref 20–29)
Calcium: 9.1 mg/dL (ref 8.7–10.2)
Chloride: 100 mmol/L (ref 96–106)
Creatinine, Ser: 0.87 mg/dL (ref 0.57–1.00)
Globulin, Total: 3 g/dL (ref 1.5–4.5)
Glucose: 98 mg/dL (ref 70–99)
Potassium: 3.6 mmol/L (ref 3.5–5.2)
Sodium: 140 mmol/L (ref 134–144)
Total Protein: 7.3 g/dL (ref 6.0–8.5)
eGFR: 90 mL/min/{1.73_m2} (ref >59)

## 2021-10-13 MED ORDER — AMOXICILLIN-POT CLAVULANATE 875-125 MG PO TABS
1.0000 | ORAL_TABLET | Freq: Two times a day (BID) | ORAL | 0 refills | Status: DC
  Filled 2021-10-13: qty 10, 5d supply, fill #0

## 2021-10-13 NOTE — Assessment & Plan Note (Signed)
Pt has bilateral erythema surrounding outer edges of TM, uniform pharyngeal erythema without exudate, nasal congestion, headache, frontal sinus tenderness, and cervical adenopathy. Will treat with Augmentin BID and OTC antihistamines. Suspect sinus infection in the presence of underlying allergic rhinitis. No follow-up necessary unless symptoms worsen or no improvement after antibiotic therapy.

## 2021-10-13 NOTE — Patient Instructions (Addendum)
Blood pressures are looking great! I will check your kidney function today to make sure it is looking OK with the medication. I am so glad this is better!!!  We will send in medication for suspected sinus infection. I do think that you would benefit from daily allergy medication to help keep the allergies under control and hopefully prevent these kinds of symptoms. If this does not improve over the next week, let me know.  You may take mucinex for increased drainage and tylenol or ibuprofen for pain, fevers.

## 2021-10-13 NOTE — Progress Notes (Deleted)
hold

## 2021-10-13 NOTE — Progress Notes (Signed)
Established Patient Office Visit  Subjective:  Patient ID: Linda Weber, female    DOB: 12/31/1985  Age: 36 y.o. MRN: [id number]  CC:  Chief Complaint  Patient presents with   Follow-up    Patient presents today for follow up of blood pressure and labs. She is feeling great her BP numbers have been good since starting the medication. She does have some hoariness today due to coughing that started over the weekend with sinus drainage.     HPI Linda Weber presents for HTN follow-up after initiating Diltiazem. Additionally presents with cough, headache, and hoarseness.  HTN Denies symptoms of shortness of breath, LE edema, CP, palpitations, headaches, dizziness. She is taking her medication as prescribed with no noted SE. She is in need of labs today. She reports that she feels her BP is doing well and she is not having any concerns at this time with her BP.   Cough, HA, Hoarse These symptoms began gradually progressing for approximately 4 days now. Pt has tried OTC Tylenol which helps with the headache. Cough is intermittent, dry, non-productive. She denies fever, chills, body aches, sore throat, ear pain. She reports that she feels "okay", but the cough is very bothersome.    Outpatient Medications Prior to Visit  Medication Sig Dispense Refill   acetaminophen (TYLENOL) 500 MG tablet Take 500-1,000 mg by mouth every 6 (six) hours as needed for moderate pain or headache.     Blood Pressure Monitor MISC USE TO CHECK BLOOD PRESSURE AS DIRECTED BY PHYSICIAN 1 each 0   diltiazem (CARDIZEM CD) 120 MG 24 hr capsule Take 1 capsule (120 mg total) by mouth daily. 90 capsule 3   famotidine (PEPCID) 20 MG tablet Take 20 mg by mouth 2 (two) times daily.     mometasone (ELOCON) 0.1 % cream Apply 1 application topically daily. For eczema 45 g 6   montelukast (SINGULAIR) 10 MG tablet Take 1 tablet (10 mg total) by mouth at bedtime. 90 tablet 3   amoxicillin-clavulanate (AUGMENTIN) 875-125 MG  tablet Take 1 tablet by mouth 2 (two) times daily. 10 tablet 0   No facility-administered medications prior to visit.    Allergies  Allergen Reactions   Benadryl [Diphenhydramine Hcl] Hives    ROS Review of Systems  HENT:  Positive for congestion, postnasal drip and sinus pressure.   Respiratory:  Positive for cough.   Neurological:  Positive for headaches.  All other systems reviewed and are negative.    Objective:    Physical Exam Vitals and nursing note reviewed.  Constitutional:      Appearance: Normal appearance.  HENT:     Right Ear: Tympanic membrane is erythematous.     Left Ear: Tympanic membrane is erythematous.     Nose: Nose normal.     Mouth/Throat:     Mouth: Mucous membranes are moist.     Pharynx: Posterior oropharyngeal erythema present.  Eyes:     Extraocular Movements: Extraocular movements intact.     Conjunctiva/sclera:     Left eye: Hemorrhage present.     Pupils: Pupils are equal, round, and reactive to light.   Neck:     Vascular: No carotid bruit.  Cardiovascular:     Rate and Rhythm: Normal rate and regular rhythm.     Pulses: Normal pulses.     Heart sounds: Normal heart sounds. No murmur heard. Pulmonary:     Effort: Pulmonary effort is normal.     Breath sounds: Normal breath  sounds. No wheezing.  Musculoskeletal:     Right lower leg: No edema.     Left lower leg: No edema.  Lymphadenopathy:     Cervical: Cervical adenopathy present.     Left cervical: Superficial cervical adenopathy present.  Skin:    General: Skin is warm and dry.     Capillary Refill: Capillary refill takes less than 2 seconds.  Neurological:     General: No focal deficit present.     Mental Status: She is alert and oriented to person, place, and time.  Psychiatric:        Mood and Affect: Mood normal.        Behavior: Behavior normal. Behavior is cooperative.        Thought Content: Thought content normal.        Judgment: Judgment normal.    BP 117/72     Pulse 99    Ht 5\' 2"  (1.575 m)    Wt 296 lb (134.3 kg)    SpO2 98%    BMI 54.14 kg/m  Wt Readings from Last 3 Encounters:  10/13/21 296 lb (134.3 kg)  04/13/21 292 lb 12.8 oz (132.8 kg)  03/12/21 280 lb (127 kg)    Assessment & Plan:   Problem List Items Addressed This Visit       Cardiovascular and Mediastinum   Hypertension - Primary    Pt responding well to CCB treatment with Diltiazem. Vital signs this encounter WNL. Will continue at current dose of 120mg  daily. Encouraged pt to check BP and record results at home if possible. UA performed to assess renal function. CMP collected to assess electrolyte imbalances. Will reassess in 6 months or sooner if pt experiences side effects.       Relevant Orders   Comprehensive metabolic panel   POCT UA - Microalbumin     Respiratory   Acute non-recurrent maxillary sinusitis    Pt has bilateral erythema surrounding outer edges of TM, uniform pharyngeal erythema without exudate, nasal congestion, headache, frontal sinus tenderness, and cervical adenopathy. Will treat with Augmentin BID and OTC antihistamines. Suspect sinus infection in the presence of underlying allergic rhinitis. No follow-up necessary unless symptoms worsen or no improvement after antibiotic therapy.       Relevant Medications   amoxicillin-clavulanate (AUGMENTIN) 875-125 MG tablet   Other Visit Diagnoses     Acute non-recurrent pansinusitis       Relevant Medications   amoxicillin-clavulanate (AUGMENTIN) 875-125 MG tablet   Eye exam, routine       Relevant Orders   Ambulatory referral to Ophthalmology       Meds ordered this encounter  Medications   amoxicillin-clavulanate (AUGMENTIN) 875-125 MG tablet    Sig: Take 1 tablet by mouth 2 (two) times daily.    Dispense:  10 tablet    Refill:  0    Follow-up: Return in about 6 months (around 04/12/2022) for HTN and labs.    , RN    Patient seen along with NP student, 04/14/2022, at  today's visit. Portions of documentation and assessment have been completed by the student listed.  Loreta Ave, NP, have reviewed all documentation completed by the student. The documentation on 10/13/21 for the exam, diagnosis, procedures, and orders are all accurate and complete.  Suzanna Obey, NP,have personally seen and evaluated the patient during this encounter.  While the patient was in clinic, I reviewed the patients medical history, the student's findings on physical  examination, and the patients diagnosis and treatment plan. All aspects of care were discussed with the the student and I agree with the information documented.   Sherolyn Trettin, Sung Amabile, NP, DNp, AGNP-c Primary Care & Sports Medicine at [facility], [facility]

## 2021-10-13 NOTE — Assessment & Plan Note (Addendum)
Pt responding well to CCB treatment with Diltiazem. Vital signs this encounter WNL. Will continue at current dose of 120mg  daily. Encouraged pt to check BP and record results at home if possible. UA performed to assess renal function. CMP collected to assess electrolyte imbalances. Will reassess in 6 months or sooner if pt experiences side effects.

## 2021-10-16 ENCOUNTER — Other Ambulatory Visit: Payer: Self-pay

## 2021-10-16 ENCOUNTER — Encounter (HOSPITAL_BASED_OUTPATIENT_CLINIC_OR_DEPARTMENT_OTHER): Payer: Self-pay | Admitting: Medical

## 2021-10-16 ENCOUNTER — Encounter (HOSPITAL_BASED_OUTPATIENT_CLINIC_OR_DEPARTMENT_OTHER): Payer: Self-pay | Admitting: Nurse Practitioner

## 2021-10-16 ENCOUNTER — Ambulatory Visit (INDEPENDENT_AMBULATORY_CARE_PROVIDER_SITE_OTHER): Payer: Self-pay | Admitting: Medical

## 2021-10-16 ENCOUNTER — Other Ambulatory Visit (HOSPITAL_COMMUNITY)
Admission: RE | Admit: 2021-10-16 | Discharge: 2021-10-16 | Disposition: A | Discharge status: Home / Self Care | Payer: Self-pay | Source: Ambulatory Visit | Attending: Medical | Admitting: Medical

## 2021-10-16 VITALS — BP 159/111 | HR 93 | Ht 63.5 in | Wt 294.0 lb

## 2021-10-16 DIAGNOSIS — A599 Trichomoniasis, unspecified: Secondary | ICD-10-CM

## 2021-10-16 DIAGNOSIS — Z01419 Encounter for gynecological examination (general) (routine) without abnormal findings: Secondary | ICD-10-CM | POA: Insufficient documentation

## 2021-10-16 DIAGNOSIS — Z8742 Personal history of other diseases of the female genital tract: Secondary | ICD-10-CM

## 2021-10-16 DIAGNOSIS — Z7689 Persons encountering health services in other specified circumstances: Secondary | ICD-10-CM

## 2021-10-16 NOTE — Progress Notes (Signed)
? ?History:  ?Ms. Linda Weber is a 36 y.o. G0P0 who presents to clinic today for annual exam and to establish care. The patient was referred by her PCP. Last pap smear of record is from 2014 and was normal. She states last pap smear was around 2018, but we do not have records. She denies any history of abnormal pap smear. She does state a long history of irregular periods and previous diagnosis of PCOS. She is not current on birth control and does not desire pregnancy. She is sexually active only occasionally. She is bleeding today. This started 09/29/21. She states 6/10 associated cramping with bleeding. She takes Tylenol PRN for pain, but prefers not to take anything unless severe. She denies abnormal discharge, UTI symptoms, GI or breast concerns today. She is concerned about her family history of breast cancer. She had breast US for a lump in 2013 that was normal. She denies discharge from her breasts or any prominent lumps today. She is a smoker and PCP manages her HTN.  ? ?The following portions of the patient's history were reviewed and updated as appropriate: allergies, current medications, family history, past medical history, social history, past surgical history and problem list. ? ?Review of Systems:  ?Review of Systems  ?Constitutional:  Negative for fever and malaise/fatigue.  ?Gastrointestinal:  Positive for abdominal pain. Negative for constipation, diarrhea, nausea and vomiting.  ?Genitourinary:  Negative for dysuria, frequency and urgency.  ?     + vaginal bleeding ?Neg - vaginal discharge  ? ?  ?Objective:  ?Physical Exam ?BP (!) 159/111   Pulse 93   Ht 5' 3.5" (1.613 m)   Wt 294 lb (133.4 kg)   LMP 10/14/2021   BMI 51.26 kg/m?  ?Physical Exam ?Vitals and nursing note reviewed. Exam conducted with a chaperone present.  ?Constitutional:   ?   General: She is not in acute distress. ?   Appearance: She is well-developed. She is obese.  ?HENT:  ?   Head: Normocephalic and atraumatic.  ?Neck:  ?    Thyroid: No thyromegaly.  ?Cardiovascular:  ?   Rate and Rhythm: Normal rate and regular rhythm.  ?   Heart sounds: No murmur heard. ?Pulmonary:  ?   Effort: Pulmonary effort is normal. No respiratory distress.  ?   Breath sounds: Normal breath sounds. No wheezing.  ?Abdominal:  ?   General: Abdomen is flat. Bowel sounds are normal. There is no distension.  ?   Palpations: Abdomen is soft. There is no mass.  ?   Tenderness: There is no abdominal tenderness. There is no guarding or rebound.  ?Genitourinary: ?   General: Normal vulva.  ?   Vagina: Bleeding (small) present. No vaginal discharge.  ?   Cervix: No cervical motion tenderness, discharge or friability.  ?   Uterus: Not enlarged and not tender.   ?   Adnexa:     ?   Right: No mass or tenderness.      ?   Left: No mass or tenderness.    ?Musculoskeletal:  ?   Cervical back: Neck supple.  ?Skin: ?   General: Skin is warm and dry.  ?   Findings: No erythema.  ?Neurological:  ?   Mental Status: She is alert and oriented to person, place, and time.  ?Psychiatric:     ?   Mood and Affect: Mood normal.  ? ?Health Maintenance Due  ?Topic Date Due  ? COVID-19 Vaccine (1) Never done  ?  TETANUS/TDAP  Never done  ? PAP SMEAR-Modifier  05/30/2016  ? INFLUENZA VACCINE  Never done  ? ? ? ?Assessment & Plan:  ?1. Encounter to establish care ? ?2. Women's annual routine gynecological examination ?- Pap smear today  ?- Patient declines STD testing, was done recently with PCP ? ?3. PCOS ?- Discussed irregular periods and increased risk of cancer without birth control ?- Patient considering IUD, will read materials given and call to schedule when ready  ? ?Patient will return in 1-2 years for annual exam ?If pap smear is normal, next pap smear will be due 2026 ? ?Approximately 20 minutes of total time was spent with this patient on history taking, chart review, patient education, coordination of care, physical exam and documentation.  ? ?Marny Lowenstein, PA-C ?10/16/2021 ?9:07  AM ? ?

## 2021-10-21 LAB — CYTOLOGY - PAP
Comment: NEGATIVE
Comment: NEGATIVE
Diagnosis: NEGATIVE
HPV 16: NEGATIVE
HPV 18 / 45: NEGATIVE
High risk HPV: POSITIVE — AB

## 2021-10-21 MED ORDER — METRONIDAZOLE 500 MG PO TABS
2000.0000 mg | ORAL_TABLET | Freq: Once | ORAL | 0 refills | Status: AC
Start: 2021-10-21 — End: 2021-10-21

## 2021-10-21 NOTE — Addendum Note (Signed)
Addended by: Marny Lowenstein on: 10/21/2021 09:53 AM ? ? Modules accepted: Orders ? ?

## 2021-10-22 ENCOUNTER — Encounter (HOSPITAL_BASED_OUTPATIENT_CLINIC_OR_DEPARTMENT_OTHER): Payer: Self-pay

## 2021-10-26 ENCOUNTER — Other Ambulatory Visit (HOSPITAL_BASED_OUTPATIENT_CLINIC_OR_DEPARTMENT_OTHER): Payer: Self-pay

## 2021-11-11 ENCOUNTER — Ambulatory Visit (HOSPITAL_BASED_OUTPATIENT_CLINIC_OR_DEPARTMENT_OTHER): Payer: Self-pay

## 2021-11-13 ENCOUNTER — Ambulatory Visit (INDEPENDENT_AMBULATORY_CARE_PROVIDER_SITE_OTHER): Payer: Self-pay | Admitting: *Deleted

## 2021-11-13 ENCOUNTER — Other Ambulatory Visit (HOSPITAL_COMMUNITY)
Admission: RE | Admit: 2021-11-13 | Discharge: 2021-11-13 | Disposition: A | Discharge status: Home / Self Care | Payer: Self-pay | Source: Ambulatory Visit | Attending: Obstetrics & Gynecology | Admitting: Obstetrics & Gynecology

## 2021-11-13 DIAGNOSIS — Z8619 Personal history of other infectious and parasitic diseases: Secondary | ICD-10-CM | POA: Insufficient documentation

## 2021-11-13 NOTE — Progress Notes (Signed)
Pt here for TOC for trichomonas. Pt instructed on and performed self swab. Advised we would notify pt of test results.  ?

## 2021-11-16 LAB — CERVICOVAGINAL ANCILLARY ONLY
Comment: NEGATIVE
Trichomonas: NEGATIVE

## 2021-11-30 ENCOUNTER — Encounter (HOSPITAL_BASED_OUTPATIENT_CLINIC_OR_DEPARTMENT_OTHER): Payer: Self-pay | Admitting: Nurse Practitioner

## 2022-03-02 ENCOUNTER — Encounter (HOSPITAL_BASED_OUTPATIENT_CLINIC_OR_DEPARTMENT_OTHER): Payer: Self-pay | Admitting: Nurse Practitioner

## 2022-04-12 ENCOUNTER — Ambulatory Visit (INDEPENDENT_AMBULATORY_CARE_PROVIDER_SITE_OTHER): Payer: Self-pay | Admitting: Nurse Practitioner

## 2022-04-12 ENCOUNTER — Encounter (HOSPITAL_BASED_OUTPATIENT_CLINIC_OR_DEPARTMENT_OTHER): Payer: Self-pay | Admitting: Nurse Practitioner

## 2022-04-12 VITALS — BP 157/106 | HR 88 | Ht 62.0 in | Wt 290.0 lb

## 2022-04-12 DIAGNOSIS — S025XXA Fracture of tooth (traumatic), initial encounter for closed fracture: Secondary | ICD-10-CM

## 2022-04-12 DIAGNOSIS — R4586 Emotional lability: Secondary | ICD-10-CM

## 2022-04-12 DIAGNOSIS — I10 Essential (primary) hypertension: Secondary | ICD-10-CM

## 2022-04-12 DIAGNOSIS — Z8742 Personal history of other diseases of the female genital tract: Secondary | ICD-10-CM

## 2022-04-12 MED ORDER — FUROSEMIDE 20 MG PO TABS: 20.0000 mg | ORAL_TABLET | Freq: Every day | ORAL | 3 refills | Status: DC

## 2022-04-12 MED ORDER — DILTIAZEM HCL ER COATED BEADS 120 MG PO CP24: 120.0000 mg | ORAL_CAPSULE | Freq: Every day | ORAL | 3 refills | Status: DC

## 2022-04-12 NOTE — Progress Notes (Signed)
[name], DNP, AGNP-c [facility] & Sports Medicine [address] [place], [place] [address] [phone] Office [phone] Fax  ESTABLISHED PATIENT- Chronic Health and/or Follow-Up Visit  Blood pressure (!) 157/106, pulse 88, height 5\' 2"  (1.575 m), weight 290 lb (131.5 kg), SpO2 99 %.    Linda Weber is a 36 y.o. year old female presenting today for evaluation and management of the following: Follow-up (Patient presents today for follow up./)   Hypertension, unspecified type Linda Weber reports she is doing well on diltiazem.  She is not having any side effects of the medication.  She tells me that her blood pressure is under good control.  She denies headaches, vision changes, palpitations, dizziness.  She is having some bilateral swelling in her feet.   History of PCOS She was recently seen for excessive menstrual bleeding.  She tells me that she has stopped bleeding at this time however she has not heard anything from ultrasound yet.  She denies any abdominal pain, bloating, discharge, risks for STI.  Closed broken tooth due to trauma without complication, initial encounter She endorses breaking a tooth recently.  She would like a referral to dentistry for evaluation.     All ROS negative with exception of what is listed above.   PHYSICAL EXAM Physical Exam Vitals and nursing note reviewed.  Constitutional:      General: She is not in acute distress.    Appearance: Normal appearance.  HENT:     Head: Normocephalic.     Mouth/Throat:     Dentition: Abnormal dentition. Dental tenderness present.  Eyes:     Extraocular Movements: Extraocular movements intact.     Conjunctiva/sclera: Conjunctivae normal.     Pupils: Pupils are equal, round, and reactive to light.  Neck:     Vascular: No carotid bruit.  Cardiovascular:     Rate and Rhythm: Normal rate and regular rhythm.     Pulses: Normal pulses.     Heart sounds: Normal heart sounds.  No murmur heard. Pulmonary:     Effort: Pulmonary effort is normal.     Breath sounds: Normal breath sounds. No wheezing.  Abdominal:     General: Bowel sounds are normal. There is no distension.     Palpations: Abdomen is soft.     Tenderness: There is no abdominal tenderness. There is no guarding.  Musculoskeletal:        General: Normal range of motion.     Cervical back: Normal range of motion and neck supple.     Right lower leg: No edema.     Left lower leg: No edema.  Lymphadenopathy:     Cervical: No cervical adenopathy.  Skin:    General: Skin is warm and dry.     Capillary Refill: Capillary refill takes less than 2 seconds.  Neurological:     General: No focal deficit present.     Mental Status: She is alert and oriented to person, place, and time.  Psychiatric:        Mood and Affect: Mood normal.        Behavior: Behavior normal.        Thought Content: Thought content normal.        Judgment: Judgment normal.     PLAN Problem List Items Addressed This Visit     Obesity, unspecified    Chronic.  Labs today.      Relevant Medications   furosemide (LASIX) 20 MG tablet   Other  Relevant Orders   Iron, TIBC and Ferritin Panel (Completed)   Hemoglobin A1c (Completed)   Comprehensive metabolic panel (Completed)   CBC With Diff/Platelet (Completed)   VITAMIN D 25 Hydroxy (Vit-D Deficiency, Fractures) (Completed)   Hypertension - Primary    Chronic.  Blood pressure extremely elevated in the office today.  Will increase diltiazem to 120 mg and add furosemide for edema.  Encouraged the patient to take medication daily.  Follow-up in 4 weeks.      Relevant Medications   furosemide (LASIX) 20 MG tablet   Other Relevant Orders   Iron, TIBC and Ferritin Panel (Completed)   Hemoglobin A1c (Completed)   Comprehensive metabolic panel (Completed)   CBC With Diff/Platelet (Completed)   VITAMIN D 25 Hydroxy (Vit-D Deficiency, Fractures) (Completed)   History of PCOS     Chronic.  Recent abnormal vaginal bleeding with prolonged bleeding.  Patient has not heard from ultrasound.  Encouraged the patient to contact [facility] imaging.      Relevant Medications   furosemide (LASIX) 20 MG tablet   Other Relevant Orders   Iron, TIBC and Ferritin Panel (Completed)   Hemoglobin A1c (Completed)   Comprehensive metabolic panel (Completed)   CBC With Diff/Platelet (Completed)   VITAMIN D 25 Hydroxy (Vit-D Deficiency, Fractures) (Completed)   Other Visit Diagnoses     Closed broken tooth due to trauma without complication, initial encounter       Relevant Orders   Ambulatory referral to Dentistry       Return in about 6 months (around 10/13/2022) for Labs, HTN.   [name], DNP, AGNP-c 04/12/2022  3:19 PM

## 2022-04-12 NOTE — Patient Instructions (Addendum)
It was a pleasure seeing you today. I hope your time spent with [place] was pleasant and helpful. Please let us know if there is anything we can do to improve the service you receive.   I want you to do some exercises to help reduce the spasm in your neck. Take a muscle relaxer at bedtime for at least the next two or three nights.   Check your blood pressures about once a day and in about a week send me the readings through MyChart so I can see if we need to make changes.  We will check back in 6 months.    Important Office Information Lab Results If labs were ordered, please note that you will see results through MyChart as soon as they come available from [facility].  It takes up to 5 business days for the results to be routed to me and for me to review them once all of the lab results have come through from [facility]. I will make recommendations based on your results and send these through MyChart or someone from the office will call you to discuss. If your labs are abnormal, we may contact you to schedule a visit to discuss the results and make recommendations.  If you have not heard from [place] within 5 business days or you have waited longer than a week and your lab results have not come through on MyChart, please feel free to call the office or send a message through MyChart to follow-up on these labs.   Referrals If referrals were placed today, the office where the referral was sent will contact you either by phone or through MyChart to set up scheduling. Please note that it can take up to a week for the referral office to contact you. If you do not hear from them in a week, please contact the referral office directly to inquire about scheduling.   Condition Treated If your condition worsens or you begin to have new symptoms, please schedule a follow-up appointment for further evaluation. If you are not sure if an appointment is needed, you may call the office to leave a message for the nurse and  someone will contact you with recommendations.  If you have an urgent or life threatening emergency, please do not call the office, but seek emergency evaluation by calling 911 or going to the nearest emergency room for evaluation.   MyChart and Phone Calls Please do not use MyChart for urgent messages. It may take up to 3 business days for MyChart messages to be read by staff and if they are unable to handle the request, an additional 3 business days for them to be routed to me and for my response.  Messages sent to the provider through MyChart do not come directly to the provider, please allow time for these messages to be routed and for me to respond.  We get a large volume of MyChart messages daily and these are responded to in the order received.   For urgent messages, please call the office at [phone] and speak with the front office staff or leave a message on the line of my assistant for guidance.  We are seeing patients from the hours of 8:00 am through 5:00 pm and calls directly to the nurse may not be answered immediately due to seeing patients, but your call will be returned as soon as possible.  Phone  messages received after 4:00 PM Monday through Thursday may not be returned until the following business  day. Phone messages received after 11:00 AM on Friday may not be returned until Monday.   After Hours We share on call hours with providers from other offices. If you have an urgent need after hours that cannot wait until the next business day, please contact the on call provider by calling the office number. A nurse will speak with you and contact the provider if needed for recommendations.  If you have an urgent or life threatening emergency after hours, please do not call the on call provider, but seek emergency evaluation by calling 911 or going to the nearest emergency room for evaluation.   Paperwork All paperwork requires a minimum of 5 days to complete and return to you or  the designated personnel. Please keep this in mind when bringing in forms or sending requests for paperwork completion to the office.

## 2022-04-13 ENCOUNTER — Encounter (HOSPITAL_BASED_OUTPATIENT_CLINIC_OR_DEPARTMENT_OTHER): Payer: Self-pay | Admitting: Nurse Practitioner

## 2022-04-13 LAB — COMPREHENSIVE METABOLIC PANEL
ALT: 19 IU/L (ref 0–32)
AST: 24 IU/L (ref 0–40)
Albumin/Globulin Ratio: 1.4 (ref 1.2–2.2)
Albumin: 4.3 g/dL (ref 3.9–4.9)
Alkaline Phosphatase: 90 IU/L (ref 44–121)
BUN/Creatinine Ratio: 13 (ref 9–23)
BUN: 13 mg/dL (ref 6–20)
Bilirubin Total: 0.6 mg/dL (ref 0.0–1.2)
CO2: 25 mmol/L (ref 20–29)
Calcium: 9.7 mg/dL (ref 8.7–10.2)
Chloride: 100 mmol/L (ref 96–106)
Creatinine, Ser: 0.98 mg/dL (ref 0.57–1.00)
Globulin, Total: 3.1 g/dL (ref 1.5–4.5)
Glucose: 87 mg/dL (ref 70–99)
Potassium: 3.8 mmol/L (ref 3.5–5.2)
Sodium: 141 mmol/L (ref 134–144)
Total Protein: 7.4 g/dL (ref 6.0–8.5)
eGFR: 77 mL/min/{1.73_m2} (ref >59)

## 2022-04-13 LAB — CBC WITH DIFF/PLATELET
Basophils Absolute: 0.1 10*3/uL (ref 0.0–0.2)
Basos: 1 %
EOS (ABSOLUTE): 0.1 10*3/uL (ref 0.0–0.4)
Eos: 1 %
Hematocrit: 42.2 % (ref 34.0–46.6)
Hemoglobin: 13.6 g/dL (ref 11.1–15.9)
Immature Grans (Abs): 0 10*3/uL (ref 0.0–0.1)
Immature Granulocytes: 0 %
Lymphocytes Absolute: 2 10*3/uL (ref 0.7–3.1)
Lymphs: 29 %
MCH: 23.9 pg — ABNORMAL LOW (ref 26.6–33.0)
MCHC: 32.2 g/dL (ref 31.5–35.7)
MCV: 74 fL — ABNORMAL LOW (ref 79–97)
Monocytes Absolute: 0.5 10*3/uL (ref 0.1–0.9)
Monocytes: 7 %
Neutrophils Absolute: 4.3 10*3/uL (ref 1.4–7.0)
Neutrophils: 62 %
Platelets: 321 10*3/uL (ref 150–450)
RBC: 5.7 x10E6/uL — ABNORMAL HIGH (ref 3.77–5.28)
RDW: 14.6 % (ref 11.7–15.4)
WBC: 7 10*3/uL (ref 3.4–10.8)

## 2022-04-13 LAB — IRON,TIBC AND FERRITIN PANEL
Ferritin: 64 ng/mL (ref 15–150)
Iron Saturation: 28 % (ref 15–55)
Iron: 111 ug/dL (ref 27–159)
Total Iron Binding Capacity: 396 ug/dL (ref 250–450)
UIBC: 285 ug/dL (ref 131–425)

## 2022-04-13 LAB — HEMOGLOBIN A1C
Est. average glucose Bld gHb Est-mCnc: 126 mg/dL
Hgb A1c MFr Bld: 6 % — ABNORMAL HIGH (ref 4.8–5.6)

## 2022-04-13 LAB — VITAMIN D 25 HYDROXY (VIT D DEFICIENCY, FRACTURES): Vit D, 25-Hydroxy: 22.2 ng/mL — ABNORMAL LOW (ref 30.0–100.0)

## 2022-04-15 NOTE — Telephone Encounter (Signed)
Per patient chart, referral to dentistry has been placed on 8/28. Communicate details to patient.

## 2022-04-15 NOTE — Telephone Encounter (Signed)
Spoke with patient she is aware that referral has been made and they will call her to schedule appointment.

## 2022-04-26 ENCOUNTER — Other Ambulatory Visit (HOSPITAL_BASED_OUTPATIENT_CLINIC_OR_DEPARTMENT_OTHER): Payer: Self-pay | Admitting: Nurse Practitioner

## 2022-04-26 DIAGNOSIS — I10 Essential (primary) hypertension: Secondary | ICD-10-CM

## 2022-04-26 MED ORDER — DILTIAZEM HCL ER COATED BEADS 180 MG PO CP24: 180.0000 mg | ORAL_CAPSULE | Freq: Every day | ORAL | 2 refills | Status: DC

## 2022-04-30 IMAGING — CR DG CHEST 2V
2 series · 2 of 2 positions shown · non-contrast
Comparison: Prior radiograph from 01/28/2019.

CLINICAL DATA: Initial evaluation for acute intermittent headache,
hypertension.

EXAM:
CHEST - 2 VIEW

[chest pa]
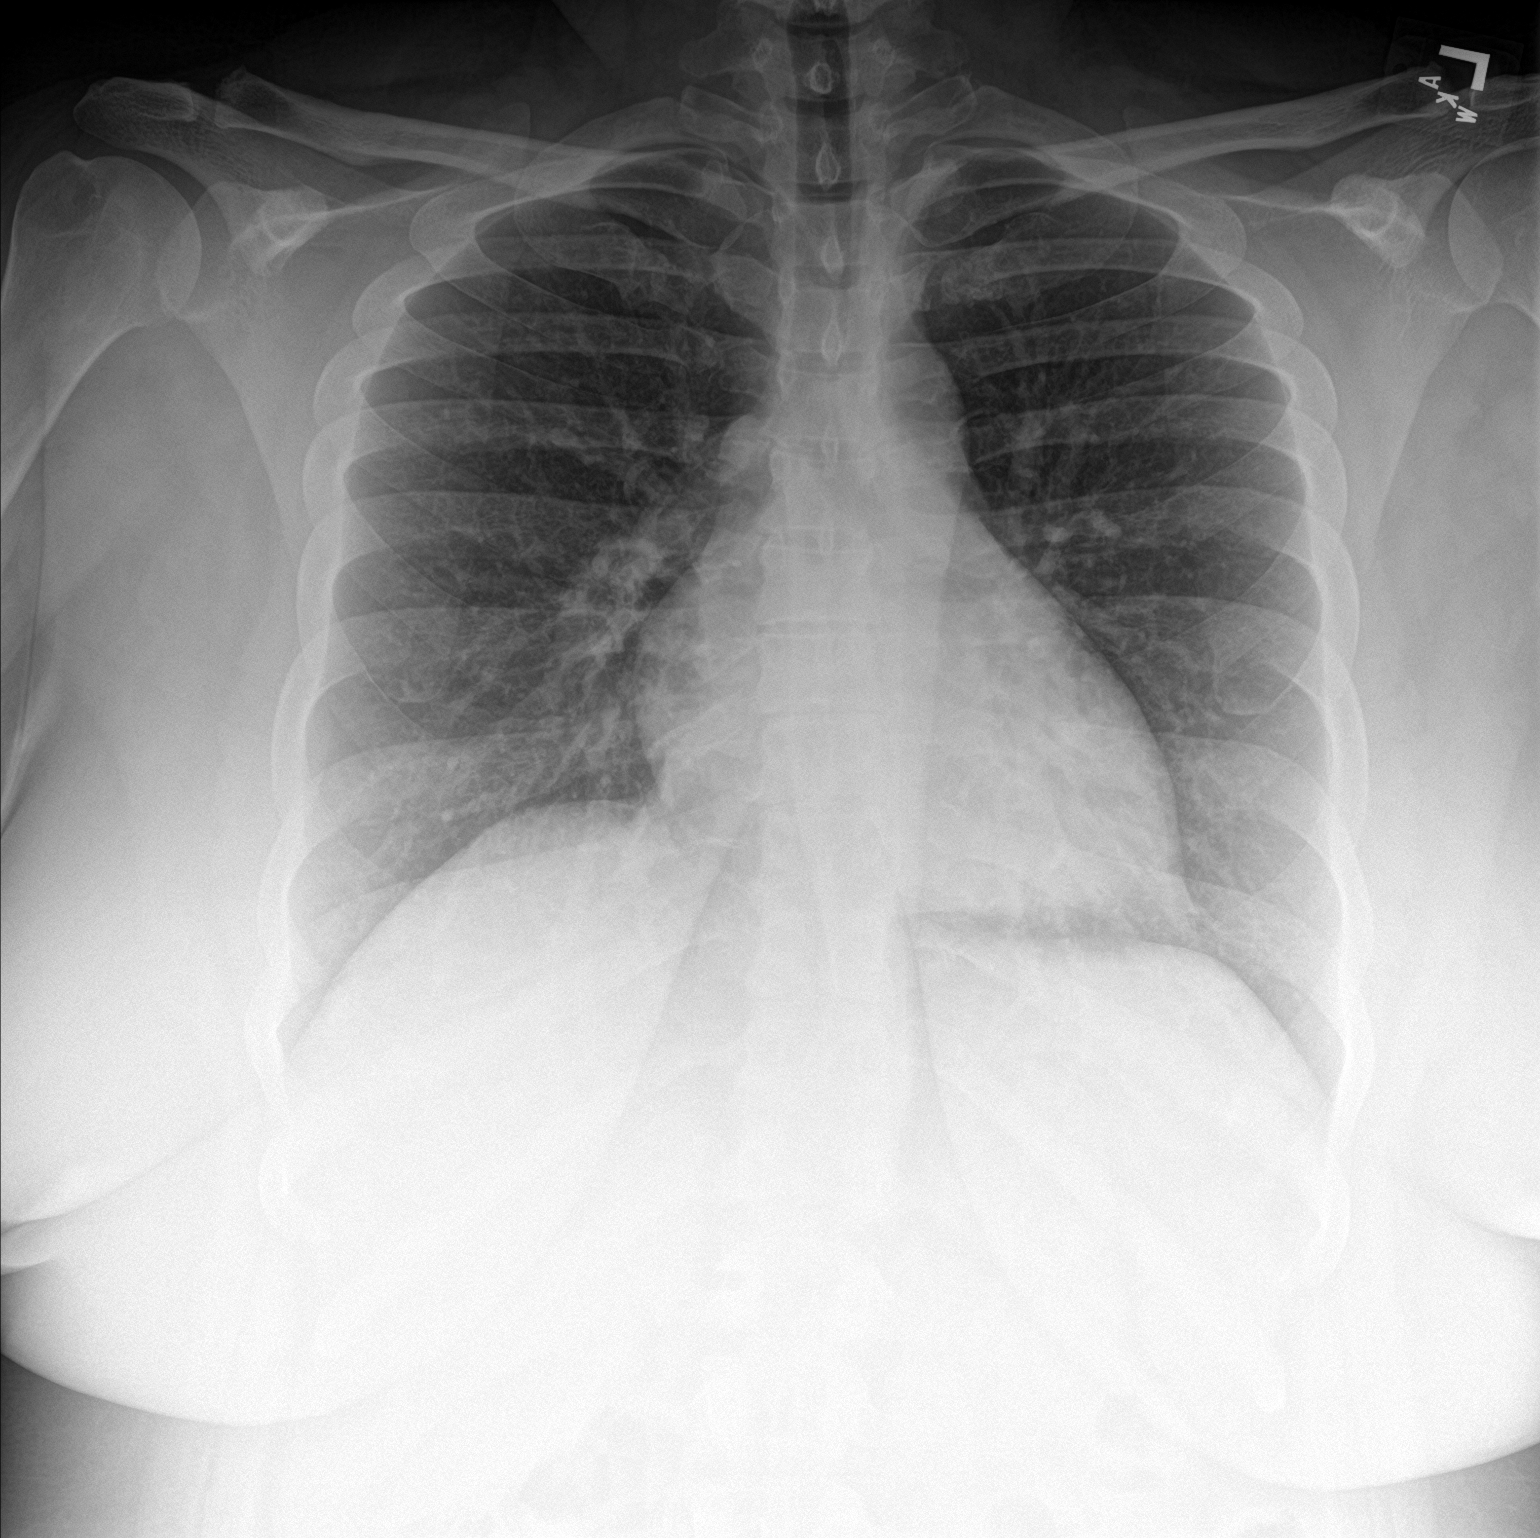

[chest lat]
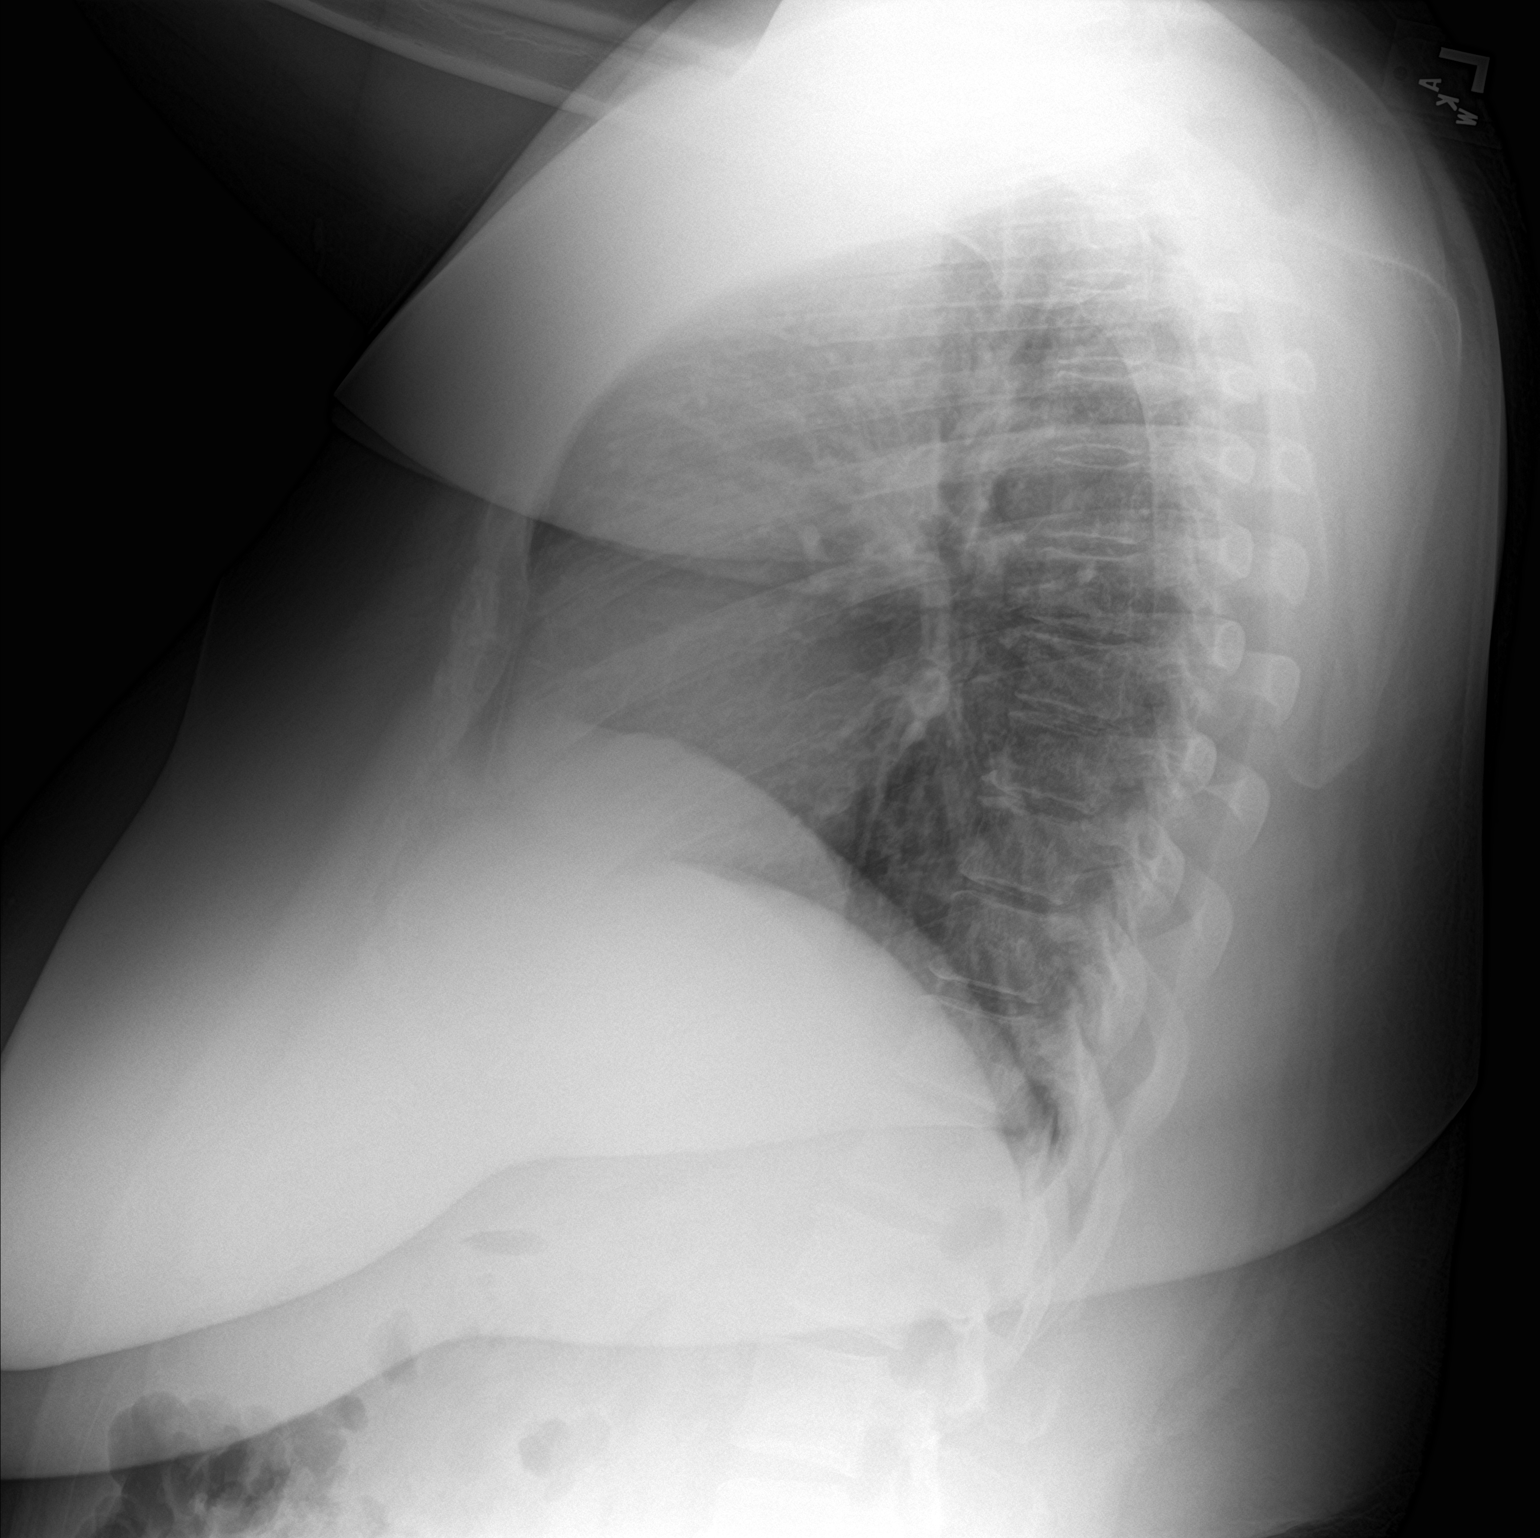

[2 of 2 positions shown; findings below may reference images not displayed]

FINDINGS: The cardiac and mediastinal silhouettes are stable in size and
contour, and remain within normal limits.

The lungs are normally inflated. No airspace consolidation, pleural
effusion, or pulmonary edema. No pneumothorax.

No acute osseous abnormality.
IMPRESSION: No active cardiopulmonary disease.

## 2022-05-14 NOTE — Assessment & Plan Note (Signed)
Chronic.  Recent abnormal vaginal bleeding with prolonged bleeding.  Patient has not heard from ultrasound.  Encouraged the patient to contact [facility] imaging.

## 2022-05-14 NOTE — Assessment & Plan Note (Addendum)
Chronic.  Blood pressure extremely elevated in the office today.  Will increase diltiazem to 120 mg and add furosemide for edema.  Encouraged the patient to take medication daily.  Follow-up in 4 weeks.

## 2022-05-14 NOTE — Assessment & Plan Note (Signed)
Chronic. Labs today.  

## 2022-07-27 ENCOUNTER — Telehealth: Payer: Self-pay | Admitting: Nurse Practitioner

## 2022-07-27 ENCOUNTER — Other Ambulatory Visit: Payer: Self-pay

## 2022-07-27 DIAGNOSIS — I10 Essential (primary) hypertension: Secondary | ICD-10-CM

## 2022-07-27 MED ORDER — DILTIAZEM HCL ER COATED BEADS 180 MG PO CP24
180.0000 mg | ORAL_CAPSULE | Freq: Every day | ORAL | 2 refills | Status: DC
Start: 2022-07-27 — End: 2022-10-14

## 2022-07-27 NOTE — Telephone Encounter (Signed)
Needs refill on diltiazem  [facility] [facility] [address]

## 2022-10-14 ENCOUNTER — Ambulatory Visit (INDEPENDENT_AMBULATORY_CARE_PROVIDER_SITE_OTHER): Payer: Commercial Managed Care - HMO | Admitting: Nurse Practitioner

## 2022-10-14 ENCOUNTER — Encounter: Payer: Self-pay | Admitting: Nurse Practitioner

## 2022-10-14 ENCOUNTER — Ambulatory Visit (HOSPITAL_BASED_OUTPATIENT_CLINIC_OR_DEPARTMENT_OTHER): Payer: Self-pay | Admitting: Nurse Practitioner

## 2022-10-14 VITALS — BP 110/70 | HR 60 | Ht 62.0 in | Wt 290.2 lb

## 2022-10-14 DIAGNOSIS — Z8742 Personal history of other diseases of the female genital tract: Secondary | ICD-10-CM

## 2022-10-14 DIAGNOSIS — R718 Other abnormality of red blood cells: Secondary | ICD-10-CM | POA: Diagnosis not present

## 2022-10-14 DIAGNOSIS — Z803 Family history of malignant neoplasm of breast: Secondary | ICD-10-CM

## 2022-10-14 DIAGNOSIS — L308 Other specified dermatitis: Secondary | ICD-10-CM

## 2022-10-14 DIAGNOSIS — L2082 Flexural eczema: Secondary | ICD-10-CM

## 2022-10-14 DIAGNOSIS — N926 Irregular menstruation, unspecified: Secondary | ICD-10-CM

## 2022-10-14 DIAGNOSIS — I10 Essential (primary) hypertension: Secondary | ICD-10-CM | POA: Diagnosis not present

## 2022-10-14 DIAGNOSIS — B360 Pityriasis versicolor: Secondary | ICD-10-CM | POA: Diagnosis not present

## 2022-10-14 DIAGNOSIS — R7303 Prediabetes: Secondary | ICD-10-CM

## 2022-10-14 DIAGNOSIS — E559 Vitamin D deficiency, unspecified: Secondary | ICD-10-CM

## 2022-10-14 MED ORDER — KETOCONAZOLE 2 % EX CREA: 1.0000 | TOPICAL_CREAM | Freq: Two times a day (BID) | CUTANEOUS | 3 refills | Status: DC

## 2022-10-14 MED ORDER — TRIAMCINOLONE ACETONIDE 0.1 % EX CREA: 1.0000 | TOPICAL_CREAM | Freq: Two times a day (BID) | CUTANEOUS | 3 refills | Status: DC

## 2022-10-14 MED ORDER — DILTIAZEM HCL ER COATED BEADS 180 MG PO CP24: 180.0000 mg | ORAL_CAPSULE | Freq: Every day | ORAL | 3 refills | Status: DC

## 2022-10-14 MED ORDER — PIMECROLIMUS 1 % EX CREA: TOPICAL_CREAM | Freq: Two times a day (BID) | CUTANEOUS | 6 refills | Status: DC

## 2022-10-14 NOTE — Assessment & Plan Note (Addendum)
The patient is managing eczema with Mometasone cream, however, she is still experiencing symptoms.  Plan: - Change to a stronger topical corticosteroid for treatment and add elidel - Assess for potential allergens or irritants exacerbating the condition.

## 2022-10-14 NOTE — Assessment & Plan Note (Signed)
The patient's hypertension is controlled with diltiazem 180 mg 24-hour capsule, with current blood pressure readings at 110/70. Plan: - Continue current medication regimen. - Encourage home blood pressure monitoring and device calibration if necessary. - Reinforce lifestyle modifications including a low-sodium diet, regular exercise, and smoking cessation.

## 2022-10-14 NOTE — Progress Notes (Signed)
Worthy Keeler, DNP, AGNP-c [facility]  [address] [place], [place] [address] [phone]  ESTABLISHED PATIENT- Chronic Health and/or Follow-Up Visit  Blood pressure 110/70, pulse 60, height '5\' 2"'$  (1.575 m), weight 290 lb 3.2 oz (131.6 kg), last menstrual period 08/30/2021.    Linda Weber is a 37 y.o. year old female presenting today for evaluation and management of the following: Susa's current blood pressure reading is 110/70. The patient is on diltiazem, taking 180 mg 24-hour extended-release capsules. She is tolerating this well.   She reports experiencing swelling in her legs and a sensation of puffiness, but does not attribute these symptoms to poor circulation. Additionally, she notes occasional numbness in her fingers when lying on them for extended periods.  The patient has a history of eczema and is currently managing a rash on her abdomen and under her arms, for which she has been using Mometasone cream. However, she feels the cream may not be sufficiently potent. She also mentions having hyperpigmentation marks on her face for the past six months.   She is requesting referrals for dental and eye care, as well as a mammogram due to her family history.   All ROS negative with exception of what is listed above.   PHYSICAL EXAM Physical Exam Vitals and nursing note reviewed.  Constitutional:      General: She is not in acute distress.    Appearance: Normal appearance.  HENT:     Head: Normocephalic.  Eyes:     Extraocular Movements: Extraocular movements intact.     Conjunctiva/sclera: Conjunctivae normal.     Pupils: Pupils are equal, round, and reactive to light.  Neck:     Vascular: No carotid bruit.  Cardiovascular:     Rate and Rhythm: Normal rate and regular rhythm.     Pulses: Normal pulses.     Heart sounds: Normal heart sounds. No murmur heard. Pulmonary:     Effort: Pulmonary effort is normal.     Breath sounds: Normal breath  sounds. No wheezing.  Abdominal:     General: Bowel sounds are normal. There is no distension.     Palpations: Abdomen is soft.     Tenderness: There is no abdominal tenderness. There is no guarding.  Musculoskeletal:        General: Normal range of motion.     Cervical back: Normal range of motion and neck supple.     Right lower leg: No edema.     Left lower leg: No edema.  Lymphadenopathy:     Cervical: No cervical adenopathy.  Skin:    General: Skin is warm and dry.     Capillary Refill: Capillary refill takes less than 2 seconds.     Findings: Rash present.     Comments: Hypopigmentation to the face around the mouth.  Hyperpigmentation and scaling to the abdomen.   Neurological:     General: No focal deficit present.     Mental Status: She is alert and oriented to person, place, and time.  Psychiatric:        Mood and Affect: Mood normal.        Behavior: Behavior normal.        Thought Content: Thought content normal.        Judgment: Judgment normal.     PLAN Problem List Items Addressed This Visit     Hypertension - Primary    The patient's hypertension is controlled with diltiazem 180 mg 24-hour capsule, with current blood  pressure readings at 110/70. Plan: - Continue current medication regimen. - Encourage home blood pressure monitoring and device calibration if necessary. - Reinforce lifestyle modifications including a low-sodium diet, regular exercise, and smoking cessation.      Relevant Medications   diltiazem (CARDIZEM CD) 180 MG 24 hr capsule   Other Relevant Orders   Ambulatory referral to Ophthalmology   Ambulatory referral to Dentistry   CBC with Differential/Platelet   Comprehensive metabolic panel   VITAMIN D 25 Hydroxy (Vit-D Deficiency, Fractures)   Hemoglobin A1c   TSH   Flexural eczema    The patient is managing eczema with Mometasone cream, however, she is still experiencing symptoms.  Plan: - Change to a stronger topical corticosteroid  for treatment and add elidel - Assess for potential allergens or irritants exacerbating the condition.      Tinea versicolor    Hypopigmentation in scattered areas around the mouth and chin. Symptoms consistent with tinea versicolor.  Plan: - Prescribe antifungal cream for topical application. - Instruct patient on proper use and duration of treatment.      Relevant Medications   pimecrolimus (ELIDEL) 1 % cream   ketoconazole (NIZORAL) 2 % cream   History of PCOS   Relevant Orders   Ambulatory referral to Ophthalmology   Ambulatory referral to Dentistry   CBC with Differential/Platelet   Comprehensive metabolic panel   VITAMIN D 25 Hydroxy (Vit-D Deficiency, Fractures)   Hemoglobin A1c   TSH   Other Visit Diagnoses     Abnormal red blood cells       Relevant Orders   Ambulatory referral to Ophthalmology   Ambulatory referral to Dentistry   CBC with Differential/Platelet   Comprehensive metabolic panel   VITAMIN D 25 Hydroxy (Vit-D Deficiency, Fractures)   Hemoglobin A1c   TSH   Abnormal menses       Relevant Orders   Ambulatory referral to Ophthalmology   Ambulatory referral to Dentistry   CBC with Differential/Platelet   Comprehensive metabolic panel   VITAMIN D 25 Hydroxy (Vit-D Deficiency, Fractures)   Hemoglobin A1c   TSH   Family history of breast cancer in female       Relevant Orders   MM 3D SCREEN BREAST BILATERAL   Other eczema       Relevant Medications   pimecrolimus (ELIDEL) 1 % cream   triamcinolone cream (KENALOG) 0.1 %   Other Relevant Orders   Ambulatory referral to Ophthalmology   Ambulatory referral to Dentistry   CBC with Differential/Platelet   Comprehensive metabolic panel   VITAMIN D 25 Hydroxy (Vit-D Deficiency, Fractures)   Hemoglobin A1c   TSH       Return in about 6 months (around 04/14/2023) for CPE.   Worthy Keeler, DNP, AGNP-c 10/14/2022  2:21 PM

## 2022-10-14 NOTE — Assessment & Plan Note (Signed)
Hypopigmentation in scattered areas around the mouth and chin. Symptoms consistent with tinea versicolor.  Plan: - Prescribe antifungal cream for topical application. - Instruct patient on proper use and duration of treatment.

## 2022-10-15 LAB — COMPREHENSIVE METABOLIC PANEL
ALT: 29 IU/L (ref 0–32)
AST: 34 IU/L (ref 0–40)
Albumin/Globulin Ratio: 1.4 (ref 1.2–2.2)
Albumin: 4.3 g/dL (ref 3.9–4.9)
Alkaline Phosphatase: 86 IU/L (ref 44–121)
BUN/Creatinine Ratio: 13 (ref 9–23)
BUN: 11 mg/dL (ref 6–20)
Bilirubin Total: 0.7 mg/dL (ref 0.0–1.2)
CO2: 25 mmol/L (ref 20–29)
Calcium: 9.4 mg/dL (ref 8.7–10.2)
Chloride: 99 mmol/L (ref 96–106)
Creatinine, Ser: 0.84 mg/dL (ref 0.57–1.00)
Globulin, Total: 3.1 g/dL (ref 1.5–4.5)
Glucose: 88 mg/dL (ref 70–99)
Potassium: 3.6 mmol/L (ref 3.5–5.2)
Sodium: 144 mmol/L (ref 134–144)
Total Protein: 7.4 g/dL (ref 6.0–8.5)
eGFR: 93 mL/min/{1.73_m2} (ref >59)

## 2022-10-15 LAB — CBC WITH DIFFERENTIAL/PLATELET
Basophils Absolute: 0.1 10*3/uL (ref 0.0–0.2)
Basos: 1 %
EOS (ABSOLUTE): 0.1 10*3/uL (ref 0.0–0.4)
Eos: 1 %
Hematocrit: 42 % (ref 34.0–46.6)
Hemoglobin: 13.7 g/dL (ref 11.1–15.9)
Immature Grans (Abs): 0 10*3/uL (ref 0.0–0.1)
Immature Granulocytes: 0 %
Lymphocytes Absolute: 2 10*3/uL (ref 0.7–3.1)
Lymphs: 31 %
MCH: 24.9 pg — ABNORMAL LOW (ref 26.6–33.0)
MCHC: 32.6 g/dL (ref 31.5–35.7)
MCV: 76 fL — ABNORMAL LOW (ref 79–97)
Monocytes Absolute: 0.5 10*3/uL (ref 0.1–0.9)
Monocytes: 8 %
Neutrophils Absolute: 3.8 10*3/uL (ref 1.4–7.0)
Neutrophils: 59 %
Platelets: 285 10*3/uL (ref 150–450)
RBC: 5.51 x10E6/uL — ABNORMAL HIGH (ref 3.77–5.28)
RDW: 14.8 % (ref 11.7–15.4)
WBC: 6.5 10*3/uL (ref 3.4–10.8)

## 2022-10-15 LAB — HEMOGLOBIN A1C
Est. average glucose Bld gHb Est-mCnc: 123 mg/dL
Hgb A1c MFr Bld: 5.9 % — ABNORMAL HIGH (ref 4.8–5.6)

## 2022-10-15 LAB — VITAMIN D 25 HYDROXY (VIT D DEFICIENCY, FRACTURES): Vit D, 25-Hydroxy: 10.6 ng/mL — ABNORMAL LOW (ref 30.0–100.0)

## 2022-10-15 LAB — TSH: TSH: 1.12 u[IU]/mL (ref 0.450–4.500)

## 2022-10-20 ENCOUNTER — Telehealth: Payer: Self-pay | Admitting: Nurse Practitioner

## 2022-10-20 DIAGNOSIS — E559 Vitamin D deficiency, unspecified: Secondary | ICD-10-CM

## 2022-10-20 DIAGNOSIS — R7303 Prediabetes: Secondary | ICD-10-CM | POA: Insufficient documentation

## 2022-10-20 MED ORDER — VITAMIN D3 1.25 MG (50000 UT) PO TABS: 1.0000 | ORAL_TABLET | ORAL | 1 refills | Status: DC

## 2022-10-20 NOTE — Addendum Note (Signed)
Addended by: Jaide Hillenburg, Clarise Cruz E on: 10/20/2022 10:37 AM   Modules accepted: Orders

## 2022-10-20 NOTE — Telephone Encounter (Signed)
[facility] called and states [employer] wont pay for the vitamin D3 but will pay for the vitamin D2 if you agree with changing this they said you can just send it in. Provided call back number [phone].  [place], [place]

## 2022-10-21 ENCOUNTER — Encounter: Payer: Self-pay | Admitting: Nurse Practitioner

## 2022-10-21 MED ORDER — VITAMIN D (ERGOCALCIFEROL) 1.25 MG (50000 UNIT) PO CAPS: 50000.0000 [IU] | ORAL_CAPSULE | ORAL | 1 refills | Status: DC

## 2022-10-21 NOTE — Telephone Encounter (Signed)
Medication changed and sent

## 2022-10-26 ENCOUNTER — Telehealth: Payer: Self-pay | Admitting: Internal Medicine

## 2022-10-26 NOTE — Telephone Encounter (Signed)
Linda Weber is unable to respond with clinical questions. Please contact [employer] at [phone].

## 2022-10-26 NOTE — Telephone Encounter (Signed)
Tried to do PA for Elidel 1% cream but her per covermymeds Patient not eligible due to non payment of premium.Please notify patient to contact their plan. I do not see updated card in chart from recent visit

## 2023-02-15 LAB — HM DIABETES EYE EXAM

## 2023-02-18 ENCOUNTER — Encounter: Payer: Self-pay | Admitting: Internal Medicine

## 2023-04-21 ENCOUNTER — Encounter: Payer: Self-pay | Admitting: Nurse Practitioner

## 2023-04-21 ENCOUNTER — Ambulatory Visit: Payer: Medicaid Other | Admitting: Nurse Practitioner

## 2023-04-21 VITALS — BP 134/78 | HR 88 | Ht 63.5 in | Wt 288.0 lb

## 2023-04-21 DIAGNOSIS — Z0000 Encounter for general adult medical examination without abnormal findings: Secondary | ICD-10-CM

## 2023-04-21 DIAGNOSIS — Z6841 Body Mass Index (BMI) 40.0 and over, adult: Secondary | ICD-10-CM

## 2023-04-21 DIAGNOSIS — R4586 Emotional lability: Secondary | ICD-10-CM

## 2023-04-21 DIAGNOSIS — R7303 Prediabetes: Secondary | ICD-10-CM

## 2023-04-21 DIAGNOSIS — Z716 Tobacco abuse counseling: Secondary | ICD-10-CM

## 2023-04-21 DIAGNOSIS — I10 Essential (primary) hypertension: Secondary | ICD-10-CM

## 2023-04-21 DIAGNOSIS — E559 Vitamin D deficiency, unspecified: Secondary | ICD-10-CM

## 2023-04-21 DIAGNOSIS — B360 Pityriasis versicolor: Secondary | ICD-10-CM

## 2023-04-21 DIAGNOSIS — K219 Gastro-esophageal reflux disease without esophagitis: Secondary | ICD-10-CM

## 2023-04-21 DIAGNOSIS — N914 Secondary oligomenorrhea: Secondary | ICD-10-CM

## 2023-04-21 DIAGNOSIS — R609 Edema, unspecified: Secondary | ICD-10-CM

## 2023-04-21 MED ORDER — PANTOPRAZOLE SODIUM 40 MG PO TBEC: 40.0000 mg | DELAYED_RELEASE_TABLET | Freq: Every day | ORAL | 3 refills | Status: DC

## 2023-04-21 MED ORDER — TERBINAFINE HCL 1 % EX CREA: 1.0000 | TOPICAL_CREAM | Freq: Two times a day (BID) | CUTANEOUS | 6 refills | Status: DC

## 2023-04-21 MED ORDER — VITAMIN D (ERGOCALCIFEROL) 1.25 MG (50000 UNIT) PO CAPS: 50000.0000 [IU] | ORAL_CAPSULE | ORAL | 3 refills | Status: DC

## 2023-04-21 NOTE — Patient Instructions (Signed)

## 2023-04-21 NOTE — Assessment & Plan Note (Signed)

## 2023-04-21 NOTE — Progress Notes (Signed)
[name], DNP, AGNP-c [facility] [address] [place], [place] [address] Main Office [phone]  BP 134/78   Pulse 88   Ht 5' 3.5" (1.613 m)   Wt 288 lb (130.6 kg)   LMP  (LMP Unknown) Comment: irregular some spotting in August  SpO2 98%   BMI 50.22 kg/m    Subjective:    Patient ID: Linda Weber, female    DOB: 21-Aug-1985, 37 y.o.   MRN: [id number]  HPI: Linda Weber is a 37 y.o. female presenting on 04/21/2023 for comprehensive medical examination.   Current medical concerns include:   Pertinent items are noted in HPI.  IMMUNIZATIONS:   Flu: Flu vaccine declined, patient will complete later Prevnar 13: Prevnar 13 N/A for this patient Prevnar 20: Prevnar 20 N/A for this patient Pneumovax 23: Pneumovax 23 N/A for this patient Vac Shingrix: Shingrix N/A for this patient HPV: HPV N/A for this patient Tetanus: Tetanus declined, patient will complete at a later date COVID: COVID declined, patient will complete at a later date  HEALTH MAINTENANCE: Pap Smear HM Status: is up to date Mammogram HM Status: is not applicable for this patient Colon Cancer Screening HM Status: is not applicable for this patient Bone Density HM Status: is not applicable for this patient STI Testing HM Status: was declined  Lung CT HM Status: is not applicable for this patient  She eats a regular diet with no significant restrictions She does not get regular exercise.   Most Recent Depression Screen:     04/21/2023    3:04 PM 10/14/2022    2:15 PM 10/16/2021    8:40 AM 02/13/2021   10:37 AM  Depression screen PHQ 2/9  Decreased Interest 2 0 0 2  Down, Depressed, Hopeless 0 0 0 2  PHQ - 2 Score 2 0 0 4  Altered sleeping 1   2  Tired, decreased energy 2   3  Change in appetite 1   3  Feeling bad or failure about yourself  0   1  Trouble concentrating 0   1  Moving slowly or fidgety/restless 0   0  Suicidal thoughts 0   0  PHQ-9 Score 6   14  Difficult doing  work/chores Not difficult at all   Not difficult at all   Most Recent Anxiety Screen:     02/13/2021   10:37 AM  GAD 7 : Generalized Anxiety Score  Nervous, Anxious, on Edge 2  Control/stop worrying 1  Worry too much - different things 1  Trouble relaxing 2  Restless 1  Easily annoyed or irritable 2  Afraid - awful might happen 1  Total GAD 7 Score 10  Anxiety Difficulty Somewhat difficult   Most Recent Fall Screen:    04/21/2023    3:03 PM 10/14/2022    2:14 PM 10/16/2021    8:40 AM 02/13/2021   10:37 AM  Fall Risk   Falls in the past year? 1 0 1 1  Number falls in past yr: 0 0 0 0  Comment hurt right knee two weeks ago     Injury with Fall? 0 0 1 1  Risk for fall due to :  No Fall Risks  History of fall(s)  Follow up Falls evaluation completed Falls evaluation completed  Falls evaluation completed    Past medical history, surgical history, medications, allergies, family history and social history reviewed with patient today and changes made to appropriate areas of the chart.  Past Medical History:  Past Medical History:  Diagnosis Date   Abnormal Pap smear    Acute non-recurrent maxillary sinusitis 04/13/2021   Alcohol use 02/13/2021   Allergic rhinitis 04/13/2021   Hypertension    Nicotine addiction 07/24/2013   Obesity    Uses marijuana 07/24/2013   Medications:  Current Outpatient Medications on File Prior to Visit  Medication Sig   diltiazem (CARDIZEM CD) 180 MG 24 hr capsule Take 1 capsule (180 mg total) by mouth daily.   triamcinolone cream (KENALOG) 0.1 % Apply 1 Application topically 2 (two) times daily. To affected area(s) as needed   [DISCONTINUED] loratadine (CLARITIN) 10 MG tablet Take 1 tablet (10 mg total) by mouth daily. (Patient not taking: Reported on 09/16/2015)   No current facility-administered medications on file prior to visit.   Surgical History:  History reviewed. No pertinent surgical history. Allergies:  Allergies  Allergen Reactions    Benadryl [Diphenhydramine Hcl] Hives   Family History:  Family History  Problem Relation Age of Onset   Breast cancer Maternal Grandmother    Diabetes Mother    Hypertension Mother        Objective:    BP 134/78   Pulse 88   Ht 5' 3.5" (1.613 m)   Wt 288 lb (130.6 kg)   LMP  (LMP Unknown) Comment: irregular some spotting in August  SpO2 98%   BMI 50.22 kg/m   Wt Readings from Last 3 Encounters:  04/21/23 288 lb (130.6 kg)  10/14/22 290 lb 3.2 oz (131.6 kg)  04/12/22 290 lb (131.5 kg)    Physical Exam Vitals and nursing note reviewed.  Constitutional:      General: She is not in acute distress.    Appearance: Normal appearance.  HENT:     Head: Normocephalic and atraumatic.     Right Ear: Hearing, tympanic membrane, ear canal and external ear normal.     Left Ear: Hearing, tympanic membrane, ear canal and external ear normal.     Nose: Nose normal.     Right Sinus: No maxillary sinus tenderness or frontal sinus tenderness.     Left Sinus: No maxillary sinus tenderness or frontal sinus tenderness.     Mouth/Throat:     Lips: Pink.     Mouth: Mucous membranes are moist.     Pharynx: Oropharynx is clear.  Eyes:     General: Lids are normal. Vision grossly intact.     Extraocular Movements: Extraocular movements intact.     Conjunctiva/sclera: Conjunctivae normal.     Pupils: Pupils are equal, round, and reactive to light.     Funduscopic exam:    Right eye: Red reflex present.        Left eye: Red reflex present.    Visual Fields: Right eye visual fields normal and left eye visual fields normal.  Neck:     Thyroid: No thyromegaly.     Vascular: No carotid bruit.  Cardiovascular:     Rate and Rhythm: Normal rate and regular rhythm.     Chest Wall: PMI is not displaced.     Pulses: Normal pulses.          Dorsalis pedis pulses are 2+ on the right side and 2+ on the left side.       Posterior tibial pulses are 2+ on the right side and 2+ on the left side.      Heart sounds: Normal heart sounds. No murmur heard. Pulmonary:     Effort:  Pulmonary effort is normal. No respiratory distress.     Breath sounds: Normal breath sounds.  Abdominal:     General: Abdomen is flat. Bowel sounds are normal. There is no distension.     Palpations: Abdomen is soft. There is no hepatomegaly, splenomegaly or mass.     Tenderness: There is no abdominal tenderness. There is no right CVA tenderness, left CVA tenderness, guarding or rebound.  Musculoskeletal:        General: Swelling present. Normal range of motion.     Cervical back: Full passive range of motion without pain, normal range of motion and neck supple. No tenderness.     Right lower leg: Edema present.     Left lower leg: Edema present.  Feet:     Left foot:     Toenail Condition: Left toenails are normal.  Lymphadenopathy:     Cervical: No cervical adenopathy.     Upper Body:     Right upper body: No supraclavicular adenopathy.     Left upper body: No supraclavicular adenopathy.  Skin:    General: Skin is warm and dry.     Capillary Refill: Capillary refill takes less than 2 seconds.     Nails: There is no clubbing.  Neurological:     General: No focal deficit present.     Mental Status: She is alert and oriented to person, place, and time.     GCS: GCS eye subscore is 4. GCS verbal subscore is 5. GCS motor subscore is 6.     Sensory: Sensation is intact.     Motor: Motor function is intact.     Coordination: Coordination is intact.     Gait: Gait is intact.     Deep Tendon Reflexes: Reflexes are normal and symmetric.  Psychiatric:        Attention and Perception: Attention normal.        Mood and Affect: Mood normal.        Speech: Speech normal.        Behavior: Behavior normal. Behavior is cooperative.        Thought Content: Thought content normal.        Cognition and Memory: Cognition and memory normal.        Judgment: Judgment normal.     Results for orders placed or performed  in visit on 02/18/23  HM DIABETES EYE EXAM  Result Value Ref Range   HM Diabetic Eye Exam No Retinopathy No Retinopathy         Assessment & Plan:   Problem List Items Addressed This Visit     Encounter for annual physical exam    CPE completed today. Review of HM activities and recommendations discussed and provided on AVS. Anticipatory guidance, diet, and exercise recommendations provided. Medications, allergies, and hx reviewed and updated as necessary. Orders placed as listed below.  Plan: - Labs ordered. Will make changes as necessary based on results.  - I will review these results and send recommendations via MyChart or a telephone call.  - F/U with CPE in 1 year or sooner for acute/chronic health needs as directed.        Relevant Medications   pantoprazole (PROTONIX) 40 MG tablet   Other Relevant Orders   Hemoglobin A1c   CBC with Differential/Platelet   Comprehensive metabolic panel   VITAMIN D 25 Hydroxy (Vit-D Deficiency, Fractures)   LP+LDL Direct   Brain natriuretic peptide   Pre-diabetes - Primary   Relevant Orders  Hemoglobin A1c   CBC with Differential/Platelet   Comprehensive metabolic panel   VITAMIN D 25 Hydroxy (Vit-D Deficiency, Fractures)   Vitamin D deficiency   Relevant Medications   Vitamin D, Ergocalciferol, (DRISDOL) 1.25 MG (50000 UNIT) CAPS capsule   Other Relevant Orders   VITAMIN D 25 Hydroxy (Vit-D Deficiency, Fractures)   Hypertension   Relevant Orders   Hemoglobin A1c   Comprehensive metabolic panel   LP+LDL Direct   Tinea versicolor   Relevant Medications   terbinafine (LAMISIL) 1 % cream   Obesity, unspecified   Relevant Orders   Hemoglobin A1c   CBC with Differential/Platelet   Comprehensive metabolic panel   VITAMIN D 25 Hydroxy (Vit-D Deficiency, Fractures)   LP+LDL Direct   Brain natriuretic peptide   Mood and affect disturbance   Gastroesophageal reflux disease   Relevant Medications   pantoprazole (PROTONIX) 40  MG tablet   Oligomenorrhea   Relevant Orders   Beta hCG quant (ref lab)   Other Visit Diagnoses     Edema, unspecified type       Relevant Orders   Hemoglobin A1c   CBC with Differential/Platelet   Comprehensive metabolic panel   VITAMIN D 25 Hydroxy (Vit-D Deficiency, Fractures)   Brain natriuretic peptide   Tobacco abuse counseling              Follow up plan: Return in about 6 months (around 10/19/2023) for Med Management 30.  NEXT PREVENTATIVE PHYSICAL DUE IN 1 YEAR.  PATIENT COUNSELING PROVIDED FOR ALL ADULT PATIENTS: A well balanced diet low in saturated fats, cholesterol, and moderation in carbohydrates.  This can be as simple as monitoring portion sizes and cutting back on sugary beverages such as soda and juice to start with.    Daily water consumption of at least 64 ounces.  Physical activity at least 180 minutes per week.  If just starting out, start 10 minutes a day and work your way up.   This can be as simple as taking the stairs instead of the elevator and walking 2-3 laps around the office  purposefully every day.   STD protection, partner selection, and regular testing if high risk.  Limited consumption of alcoholic beverages if alcohol is consumed. For men, I recommend no more than 14 alcoholic beverages per week, spread out throughout the week (max 2 per day). Avoid "binge" drinking or consuming large quantities of alcohol in one setting.  Please let me know if you feel you may need help with reduction or quitting alcohol consumption.   Avoidance of nicotine, if used. Please let me know if you feel you may need help with reduction or quitting nicotine use.   Daily mental health attention. This can be in the form of 5 minute daily meditation, prayer, journaling, yoga, reflection, etc.  Purposeful attention to your emotions and mental state can significantly improve your overall wellbeing  and  Health.  Please know that I am here to help you with all  of your health care goals and am happy to work with you to find a solution that works best for you.  The greatest advice I have received with any changes in life are to take it one step at a time, that even means if all you can focus on is the next 60 seconds, then do that and celebrate your victories.  With any changes in life, you will have set backs, and that is OK. The important thing to remember is, if you have  a set back, it is not a failure, it is an opportunity to try again! Screening Testing Mammogram Every 1 -2 years based on history and risk factors Starting at age 86 Pap Smear Ages 21-39 every 3 years Ages 39-65 every 5 years with HPV testing More frequent testing may be required based on results and history Colon Cancer Screening Every 1-10 years based on test performed, risk factors, and history Starting at age 53 Bone Density Screening Every 2-10 years based on history Starting at age 104 for women Recommendations for men differ based on medication usage, history, and risk factors AAA Screening One time ultrasound Men 40-62 years old who have every smoked Lung Cancer Screening Low Dose Lung CT every 12 months Age 31-80 years with a 30 pack-year smoking history who still smoke or who have quit within the last 15 years   Screening Labs Routine  Labs: Complete Blood Count (CBC), Complete Metabolic Panel (CMP), Cholesterol (Lipid Panel) Every 6-12 months based on history and medications May be recommended more frequently based on current conditions or previous results Hemoglobin A1c Lab Every 3-12 months based on history and previous results Starting at age 67 or earlier with diagnosis of diabetes, high cholesterol, BMI >26, and/or risk factors Frequent monitoring for patients with diabetes to ensure blood sugar control Thyroid Panel (TSH) Every 6 months based on history, symptoms, and risk factors May be repeated more often if on medication HIV One time testing for  all patients 74 and older May be repeated more frequently for patients with increased risk factors or exposure Hepatitis C One time testing for all patients 90 and older May be repeated more frequently for patients with increased risk factors or exposure Gonorrhea, Chlamydia Every 12 months for all sexually active persons 13-24 years Additional monitoring may be recommended for those who are considered high risk or who have symptoms Every 12 months for any woman on birth control, regardless of sexual activity PSA Men 31-18 years old with risk factors Additional screening may be recommended from age 56-69 based on risk factors, symptoms, and history  Vaccine Recommendations Tetanus Booster All adults every 10 years Flu Vaccine All patients 6 months and older every year COVID Vaccine All patients 12 years and older Initial dosing with booster May recommend additional booster based on age and health history HPV Vaccine 2 doses all patients age 36-26 Dosing may be considered for patients over 26 Shingles Vaccine (Shingrix) 2 doses all adults 55 years and older Pneumonia (Pneumovax 37) All adults 65 years and older May recommend earlier dosing based on health history One year apart from Prevnar 20 Pneumonia (Prevnar 14) All adults 65 years and older Dosed 1 year after Pneumovax 23 Pneumonia (Prevnar 20) One time alternative to the two dosing of 13 and 23 For all adults with initial dose of 23, 20 is recommended 1 year later For all adults with initial dose of 13, 23 is still recommended as second option 1 year later

## 2023-04-22 LAB — LP+LDL DIRECT
Cholesterol, Total: 166 mg/dL (ref 100–199)
HDL: 58 mg/dL (ref >39)
LDL Chol Calc (NIH): 72 mg/dL (ref 0–99)
LDL Direct: 75 mg/dL (ref 0–99)
Triglycerides: 221 mg/dL — ABNORMAL HIGH (ref 0–149)
VLDL Cholesterol Cal: 36 mg/dL (ref 5–40)

## 2023-04-22 LAB — CBC WITH DIFFERENTIAL/PLATELET
Basophils Absolute: 0.1 10*3/uL (ref 0.0–0.2)
Basos: 1 %
EOS (ABSOLUTE): 0.1 10*3/uL (ref 0.0–0.4)
Eos: 1 %
Hematocrit: 39.1 % (ref 34.0–46.6)
Hemoglobin: 12.5 g/dL (ref 11.1–15.9)
Immature Grans (Abs): 0 10*3/uL (ref 0.0–0.1)
Immature Granulocytes: 0 %
Lymphocytes Absolute: 2 10*3/uL (ref 0.7–3.1)
Lymphs: 30 %
MCH: 24 pg — ABNORMAL LOW (ref 26.6–33.0)
MCHC: 32 g/dL (ref 31.5–35.7)
MCV: 75 fL — ABNORMAL LOW (ref 79–97)
Monocytes Absolute: 0.4 10*3/uL (ref 0.1–0.9)
Monocytes: 6 %
Neutrophils Absolute: 4.1 10*3/uL (ref 1.4–7.0)
Neutrophils: 62 %
Platelets: 263 10*3/uL (ref 150–450)
RBC: 5.2 x10E6/uL (ref 3.77–5.28)
RDW: 14.3 % (ref 11.7–15.4)
WBC: 6.7 10*3/uL (ref 3.4–10.8)

## 2023-04-22 LAB — COMPREHENSIVE METABOLIC PANEL
ALT: 17 IU/L (ref 0–32)
AST: 22 IU/L (ref 0–40)
Albumin: 4.3 g/dL (ref 3.9–4.9)
Alkaline Phosphatase: 82 IU/L (ref 44–121)
BUN/Creatinine Ratio: 12 (ref 9–23)
BUN: 12 mg/dL (ref 6–20)
Bilirubin Total: 0.5 mg/dL (ref 0.0–1.2)
CO2: 24 mmol/L (ref 20–29)
Calcium: 9.7 mg/dL (ref 8.7–10.2)
Chloride: 99 mmol/L (ref 96–106)
Creatinine, Ser: 1.03 mg/dL — ABNORMAL HIGH (ref 0.57–1.00)
Globulin, Total: 2.9 g/dL (ref 1.5–4.5)
Glucose: 90 mg/dL (ref 70–99)
Potassium: 3.8 mmol/L (ref 3.5–5.2)
Sodium: 139 mmol/L (ref 134–144)
Total Protein: 7.2 g/dL (ref 6.0–8.5)
eGFR: 72 mL/min/{1.73_m2} (ref >59)

## 2023-04-22 LAB — BRAIN NATRIURETIC PEPTIDE: BNP: 22.9 pg/mL (ref 0.0–100.0)

## 2023-04-22 LAB — HEMOGLOBIN A1C
Est. average glucose Bld gHb Est-mCnc: 120 mg/dL
Hgb A1c MFr Bld: 5.8 % — ABNORMAL HIGH (ref 4.8–5.6)

## 2023-04-22 LAB — BETA HCG QUANT (REF LAB): hCG Quant: <1 m[IU]/mL

## 2023-04-22 LAB — VITAMIN D 25 HYDROXY (VIT D DEFICIENCY, FRACTURES): Vit D, 25-Hydroxy: 18.4 ng/mL — ABNORMAL LOW (ref 30.0–100.0)

## 2023-09-27 ENCOUNTER — Other Ambulatory Visit: Payer: Self-pay | Admitting: Nurse Practitioner

## 2023-09-27 DIAGNOSIS — I10 Essential (primary) hypertension: Secondary | ICD-10-CM

## 2023-09-29 ENCOUNTER — Other Ambulatory Visit: Payer: Self-pay

## 2023-10-19 ENCOUNTER — Encounter: Payer: Medicaid Other | Admitting: Nurse Practitioner

## 2023-10-31 ENCOUNTER — Other Ambulatory Visit: Payer: Self-pay | Admitting: Nurse Practitioner

## 2023-10-31 DIAGNOSIS — I10 Essential (primary) hypertension: Secondary | ICD-10-CM

## 2023-10-31 NOTE — Telephone Encounter (Signed)
 Copied from CRM [id number]. Topic: Clinical - Medication Refill >> Oct 31, 2023 10:30 AM Elle L wrote: Most Recent Primary Care Visit:  Provider: EARLY, SARA E  Department: [facility]  Visit Type: PHYSICAL 45  Date: 04/21/2023  Medication: diltiazem (CARDIZEM CD) 180 MG 24 hr capsule  Has the patient contacted their pharmacy? Yes  Is this the correct pharmacy for this prescription? Yes This is the patient's preferred pharmacy:   [id number] [place] - [address] AT [facility] [address] [place] [address] Phone: [phone] Fax: [phone]  Has the prescription been filled recently? No  Is the patient out of the medication? Yes  Has the patient been seen for an appointment in the last year OR does the patient have an upcoming appointment? Yes  Can we respond through MyChart? Yes  Agent: Please be advised that Rx refills may take up to 3 business days. We ask that you follow-up with your pharmacy.

## 2023-11-01 MED ORDER — DILTIAZEM HCL ER COATED BEADS 180 MG PO CP24: 180.0000 mg | ORAL_CAPSULE | Freq: Every day | ORAL | 1 refills | Status: DC

## 2023-11-04 ENCOUNTER — Encounter: Payer: Self-pay | Admitting: Nurse Practitioner

## 2023-11-04 ENCOUNTER — Ambulatory Visit (INDEPENDENT_AMBULATORY_CARE_PROVIDER_SITE_OTHER): Admitting: Nurse Practitioner

## 2023-11-04 VITALS — BP 128/82 | HR 102 | Ht 63.5 in | Wt 272.2 lb

## 2023-11-04 DIAGNOSIS — Z0000 Encounter for general adult medical examination without abnormal findings: Secondary | ICD-10-CM

## 2023-11-04 DIAGNOSIS — Z6841 Body Mass Index (BMI) 40.0 and over, adult: Secondary | ICD-10-CM

## 2023-11-04 DIAGNOSIS — I10 Essential (primary) hypertension: Secondary | ICD-10-CM

## 2023-11-04 DIAGNOSIS — K219 Gastro-esophageal reflux disease without esophagitis: Secondary | ICD-10-CM

## 2023-11-04 DIAGNOSIS — E559 Vitamin D deficiency, unspecified: Secondary | ICD-10-CM

## 2023-11-04 DIAGNOSIS — E876 Hypokalemia: Secondary | ICD-10-CM

## 2023-11-04 DIAGNOSIS — R7303 Prediabetes: Secondary | ICD-10-CM | POA: Diagnosis not present

## 2023-11-04 DIAGNOSIS — R0989 Other specified symptoms and signs involving the circulatory and respiratory systems: Secondary | ICD-10-CM

## 2023-11-04 DIAGNOSIS — E66813 Obesity, class 3: Secondary | ICD-10-CM | POA: Diagnosis not present

## 2023-11-04 DIAGNOSIS — L2082 Flexural eczema: Secondary | ICD-10-CM

## 2023-11-04 DIAGNOSIS — B360 Pityriasis versicolor: Secondary | ICD-10-CM

## 2023-11-04 MED ORDER — PANTOPRAZOLE SODIUM 40 MG PO TBEC: 40.0000 mg | DELAYED_RELEASE_TABLET | Freq: Every day | ORAL | 3 refills | Status: DC

## 2023-11-04 NOTE — Patient Instructions (Signed)
 We will check your labs today to make sure everything looks alright. I will make sure your thyroid isn't causing some of the fullness in your throat.   I want you to go ahead and restart the pantoprazole. I have sent this into the pharmacy. With the pain in your upper stomach and side along with the fullness in your throat, it is very likely that you have inflammation from too much acid. This will help get the acid down and should make all of this feel better. It can take a few weeks to fully resolve.   If we need to make any changes based on your labs, I will let you know.   Keep working on increased exercise and healthy eating habits. I have included information below that can be helpful. You have done great with weight loss without even trying :-)  WEIGHT LOSS PLANNING Your progress today shows:     11/04/2023   10:31 AM 04/21/2023    3:18 PM 04/21/2023    3:05 PM  Vitals with BMI  Height 5' 3.5"  5' 3.5"  Weight 272 lbs 3 oz  288 lbs  BMI 47.46  50.21  Systolic 128 134 045  Diastolic 82 78 80  Pulse 102  88    For best management of weight, it is vital to balance intake versus output. This means the number of calories burned per day must be less than the calories you take in with food and drink.   I recommend trying to follow a diet with the following: Calories: 1200-1500 calories per day Carbohydrates: 150-180 grams of carbohydrates per day  Why: Gives your body enough "quick fuel" for cells to maintain normal function without sending them into starvation mode.  Protein: At least 90 grams of protein per day- 30 grams with each meal Why: Protein takes longer and uses more energy than carbohydrates to break down for fuel. The carbohydrates in your meals serves as quick energy sources and proteins help use some of that extra quick energy to break down to produce long term energy. This helps you not feel hungry as quickly and protein breakdown burns calories.  Water: Drink AT LEAST 64  ounces of water per day  Why: Water is essential to healthy metabolism. Water helps to fill the stomach and keep you fuller longer. Water is required for healthy digestion and filtering of waste in the body.  Fat: Limit fats in your diet- when choosing fats, choose foods with lower fats content such as lean meats (chicken, fish, [place]).  Why: Increased fat intake leads to storage "for later". Once you burn your carbohydrate energy, your body goes into fat and protein breakdown mode to help you loose weight.  Cholesterol: Fats and oils that are LIQUID at room temperature are best. Choose vegetable oils (olive oil, avocado oil, nuts). Avoid fats that are SOLID at room temperature (animal fats, processed meats). Healthy fats are often found in whole grains, beans, nuts, seeds, and berries.  Why: Elevated cholesterol levels lead to build up of cholesterol on the inside of your blood vessels. This will eventually cause the blood vessels to become hard and can lead to high blood pressure and damage to your organs. When the blood flow is reduced, but the pressure is high from cholesterol buildup, parts of the cholesterol can break off and form clots that can go to the brain or heart leading to a stroke or heart attack.  Fiber: Increase amount of SOLUBLE the fiber in  your diet. This helps to fill you up, lowers cholesterol, and helps with digestion. Some foods high in soluble fiber are oats, peas, beans, apples, carrots, barley, and citrus fruits.   Why: Fiber fills you up, helps remove excess cholesterol, and aids in healthy digestion which are all very important in weight management.   I recommend the following as a minimum activity routine: Purposeful walk or other physical activity at least 20 minutes every single day. This means purposefully taking a walk, jog, bike, swim, treadmill, elliptical, dance, etc.  This activity should be ABOVE your normal daily activities, such as walking at work. Goal exercise  should be at least 150 minutes a week- work your way up to this.   Heart Rate: Your maximum exercise heart rate should be 220 - Your Age in Years. When exercising, get your heart rate up, but avoid going over the maximum targeted heart rate.  60-70% of your maximum heart rate is where you tend to burn the most fat. To find this number:  220 - Age In Years= Max HR  Max HR x 0.6 (or 0.7) = Fat Burning HR The Fat Burning HR is your goal heart rate while working out to burn the most fat.  NEVER exercise to the point your feel lightheaded, weak, nauseated, dizzy. If you experience ANY of these symptoms- STOP exercise! Allow yourself to cool down and your heart rate to come down. Then restart slower next time.  If at ANY TIME you feel chest pain or chest pressure during exercise, STOP IMMEDIATELY and seek medical attention.

## 2023-11-04 NOTE — Progress Notes (Signed)
 [name], DNP, AGNP-c [facility]  [address] [place], [place] [address] [phone]  ESTABLISHED PATIENT- Chronic Health and/or Follow-Up Visit  Blood pressure 128/82, pulse (!) 102, height 5' 3.5" (1.613 m), weight 272 lb 3.2 oz (123.5 kg), last menstrual period 10/13/2023.    Linda Weber is a 38 y.o. year old female presenting today for evaluation and management of chronic conditions.   History of Present Illness Linda Weber is a 38 year old female who presents for chronic management. She has additional concerns of a sensation of something stuck in her throat.  She experiences an intermittent sensation of fullness or obstruction in her throat, which sometimes leads her to induce vomiting to relieve the feeling. She has vomited her medication because it felt stuck. She is currently taking famotidine but has stopped pantoprazole.  She has a history of low potassium levels, which was noted during a recent visit to urgent care. During that visit, she underwent a comprehensive workup including blood tests and a urine sample. She was initially seen for a cough and exposure to COVID-19, but all tests for COVID-19, flu, and strep were negative. Her potassium levels have been low in the past but have been normal for the last four years until the recent drop.  She has a history of high blood pressure, which was noted to be elevated during her urgent care visit, possibly due to pain from a toe injury. She is currently on medication for blood pressure, which she was paying out of pocket for until recently when her Medicaid began covering it.  She reports a recent weight loss, noting a decrease from 288 pounds in September to 272 pounds currently, and from 294 pounds last February. She does not perceive a significant change in her clothing fit, although some pants are looser. No increased urination, hunger, palpitations, shortness of breath, or significant swelling in her  feet or ankles. No issues with bowel movements.  All ROS negative with exception of what is listed above.   PHYSICAL EXAM Physical Exam Vitals and nursing note reviewed.  Constitutional:      Appearance: Normal appearance. She is obese.  HENT:     Head: Normocephalic.     Mouth/Throat:     Mouth: Mucous membranes are moist.     Pharynx: Oropharynx is clear. No posterior oropharyngeal erythema.  Eyes:     Conjunctiva/sclera: Conjunctivae normal.     Pupils: Pupils are equal, round, and reactive to light.  Neck:     Thyroid: Thyromegaly present.     Vascular: No carotid bruit.  Cardiovascular:     Rate and Rhythm: Normal rate and regular rhythm.     Pulses: Normal pulses.     Heart sounds: Normal heart sounds.  Pulmonary:     Effort: Pulmonary effort is normal.     Breath sounds: Normal breath sounds.  Abdominal:     General: Bowel sounds are normal. There is no distension.     Palpations: Abdomen is soft.     Tenderness: There is no abdominal tenderness.  Musculoskeletal:        General: Normal range of motion.     Cervical back: Normal range of motion and neck supple. No rigidity or tenderness.  Lymphadenopathy:     Cervical: No cervical adenopathy.  Skin:    General: Skin is warm and dry.     Capillary Refill: Capillary refill takes less than 2 seconds.  Neurological:     Mental Status: She  is alert and oriented to person, place, and time.  Psychiatric:        Mood and Affect: Mood normal.      PLAN Problem List Items Addressed This Visit     Obesity, unspecified   Recommend diet and exercise management. Labs pending.       Relevant Orders   Hemoglobin A1c (Completed)   CBC with Differential/Platelet (Completed)   Comprehensive metabolic panel (Completed)   Hypertension   Blood pressure was elevated at urgent care, possibly due to pain and illness at the time. Currently well-controlled on Cardizem, with insurance covering the cost well. - Continue current  antihypertensive medication regimen with Cardizem.       Relevant Orders   CBC with Differential/Platelet (Completed)   Comprehensive metabolic panel (Completed)   Gastroesophageal reflux disease   Restart pantoprazole. Suspect contributing to throat fullness.       Relevant Medications   pantoprazole (PROTONIX) 40 MG tablet   Flexural eczema   Persistent facial skin issues. She has been using triamcinolone cream and ketoconazole but stopped using the latter due to perceived ineffectiveness. - Provide referral to a dermatologist for further evaluation and management of skin issues.      Relevant Orders   Ambulatory referral to Dermatology   Tinea versicolor   Persistent facial skin issues. She has been using triamcinolone cream and ketoconazole but stopped using the latter due to perceived ineffectiveness. - Provide referral to a dermatologist for further evaluation and management of skin issues.      Relevant Orders   Ambulatory referral to Dermatology   Pre-diabetes - Primary   Repeat labs.       Relevant Orders   Hemoglobin A1c (Completed)   CBC with Differential/Platelet (Completed)   Comprehensive metabolic panel (Completed)   Vitamin D deficiency   Repeat labs      Relevant Orders   VITAMIN D 25 Hydroxy (Vit-D Deficiency, Fractures) (Completed)   Throat fullness   ntermittent sensation of something stuck in the throat, likely due to esophagitis or gastritis. Reports vomiting pills and a sensation of fullness in the throat. Suspected esophagitis due to increased acid production, possibly exacerbated by not taking pantoprazole. Currently taking famotidine, which may alleviate symptoms but not heal the underlying issue. Mild thyromegaly noted with no nodules palpated. This may also be contributing, but given the sizing this is less likely.  - Restart pantoprazole to reduce acid production and heal esophagitis. - Check thyroid function with TSH lab test to rule out  thyroid enlargement as a cause of throat fullness.      Relevant Orders   TSH (Completed)   T4, free (Completed)   Hypokalemia   Low potassium levels recently noted at urgent care. Potassium levels have been normal for the past four years except for the recent drop. Potential causes include inadequate dietary intake, vomiting, or diarrhea. Persistent low potassium can lead to cardiac arrhythmias. - Check potassium levels to ensure she is within normal range.      RESOLVED: Encounter for annual physical exam   Relevant Medications   pantoprazole (PROTONIX) 40 MG tablet    Return in about 6 months (around 05/06/2024) for CPE.  [name], DNP, AGNP-c

## 2023-11-05 LAB — COMPREHENSIVE METABOLIC PANEL
ALT: 21 IU/L (ref 0–32)
AST: 25 IU/L (ref 0–40)
Albumin: 4.2 g/dL (ref 3.9–4.9)
Alkaline Phosphatase: 86 IU/L (ref 44–121)
BUN/Creatinine Ratio: 11 (ref 9–23)
BUN: 11 mg/dL (ref 6–20)
Bilirubin Total: 0.4 mg/dL (ref 0.0–1.2)
CO2: 23 mmol/L (ref 20–29)
Calcium: 9.5 mg/dL (ref 8.7–10.2)
Chloride: 100 mmol/L (ref 96–106)
Creatinine, Ser: 1.01 mg/dL — ABNORMAL HIGH (ref 0.57–1.00)
Globulin, Total: 3 g/dL (ref 1.5–4.5)
Glucose: 99 mg/dL (ref 70–99)
Potassium: 3.8 mmol/L (ref 3.5–5.2)
Sodium: 141 mmol/L (ref 134–144)
Total Protein: 7.2 g/dL (ref 6.0–8.5)
eGFR: 74 mL/min/{1.73_m2} (ref >59)

## 2023-11-05 LAB — CBC WITH DIFFERENTIAL/PLATELET
Basophils Absolute: 0.1 10*3/uL (ref 0.0–0.2)
Basos: 1 %
EOS (ABSOLUTE): 0.1 10*3/uL (ref 0.0–0.4)
Eos: 1 %
Hematocrit: 38.1 % (ref 34.0–46.6)
Hemoglobin: 12.5 g/dL (ref 11.1–15.9)
Immature Grans (Abs): 0 10*3/uL (ref 0.0–0.1)
Immature Granulocytes: 0 %
Lymphocytes Absolute: 1.7 10*3/uL (ref 0.7–3.1)
Lymphs: 27 %
MCH: 25.5 pg — ABNORMAL LOW (ref 26.6–33.0)
MCHC: 32.8 g/dL (ref 31.5–35.7)
MCV: 78 fL — ABNORMAL LOW (ref 79–97)
Monocytes Absolute: 0.5 10*3/uL (ref 0.1–0.9)
Monocytes: 8 %
Neutrophils Absolute: 3.8 10*3/uL (ref 1.4–7.0)
Neutrophils: 63 %
Platelets: 311 10*3/uL (ref 150–450)
RBC: 4.9 x10E6/uL (ref 3.77–5.28)
RDW: 14.8 % (ref 11.7–15.4)
WBC: 6.1 10*3/uL (ref 3.4–10.8)

## 2023-11-05 LAB — HEMOGLOBIN A1C
Est. average glucose Bld gHb Est-mCnc: 117 mg/dL
Hgb A1c MFr Bld: 5.7 % — ABNORMAL HIGH (ref 4.8–5.6)

## 2023-11-05 LAB — T4, FREE: Free T4: 1.4 ng/dL (ref 0.82–1.77)

## 2023-11-05 LAB — VITAMIN D 25 HYDROXY (VIT D DEFICIENCY, FRACTURES): Vit D, 25-Hydroxy: 55.8 ng/mL (ref 30.0–100.0)

## 2023-11-05 LAB — TSH: TSH: 1.14 u[IU]/mL (ref 0.450–4.500)

## 2023-11-09 DIAGNOSIS — R0989 Other specified symptoms and signs involving the circulatory and respiratory systems: Secondary | ICD-10-CM | POA: Insufficient documentation

## 2023-11-09 DIAGNOSIS — E876 Hypokalemia: Secondary | ICD-10-CM

## 2023-11-09 HISTORY — DX: Other specified symptoms and signs involving the circulatory and respiratory systems: R09.89

## 2023-11-09 HISTORY — DX: Hypokalemia: E87.6

## 2023-11-09 NOTE — Assessment & Plan Note (Signed)
 Repeat labs:

## 2023-11-09 NOTE — Assessment & Plan Note (Signed)
 Persistent facial skin issues. She has been using triamcinolone cream and ketoconazole but stopped using the latter due to perceived ineffectiveness. - Provide referral to a dermatologist for further evaluation and management of skin issues.

## 2023-11-09 NOTE — Assessment & Plan Note (Signed)
 Low potassium levels recently noted at urgent care. Potassium levels have been normal for the past four years except for the recent drop. Potential causes include inadequate dietary intake, vomiting, or diarrhea. Persistent low potassium can lead to cardiac arrhythmias. - Check potassium levels to ensure she is within normal range.

## 2023-11-09 NOTE — Assessment & Plan Note (Signed)
Recommend diet and exercise management. Labs pending.

## 2023-11-09 NOTE — Assessment & Plan Note (Signed)
 Blood pressure was elevated at urgent care, possibly due to pain and illness at the time. Currently well-controlled on Cardizem, with insurance covering the cost well. - Continue current antihypertensive medication regimen with Cardizem.

## 2023-11-09 NOTE — Assessment & Plan Note (Signed)
 Restart pantoprazole. Suspect contributing to throat fullness.

## 2023-11-09 NOTE — Assessment & Plan Note (Signed)
 ntermittent sensation of something stuck in the throat, likely due to esophagitis or gastritis. Reports vomiting pills and a sensation of fullness in the throat. Suspected esophagitis due to increased acid production, possibly exacerbated by not taking pantoprazole. Currently taking famotidine, which may alleviate symptoms but not heal the underlying issue. Mild thyromegaly noted with no nodules palpated. This may also be contributing, but given the sizing this is less likely.  - Restart pantoprazole to reduce acid production and heal esophagitis. - Check thyroid function with TSH lab test to rule out thyroid enlargement as a cause of throat fullness.

## 2023-11-22 ENCOUNTER — Telehealth: Payer: Self-pay | Admitting: Nurse Practitioner

## 2023-11-22 ENCOUNTER — Other Ambulatory Visit: Payer: Self-pay | Admitting: Nurse Practitioner

## 2023-11-22 DIAGNOSIS — R7989 Other specified abnormal findings of blood chemistry: Secondary | ICD-10-CM

## 2023-11-22 NOTE — Telephone Encounter (Signed)
 Copied from CRM [id number]. Topic: Clinical - Request for Lab/Test Order >> Nov 22, 2023  1:35 PM Nyra Capes wrote: Reason for CRM: patient calling in asking about lab order and what they are for.

## 2023-12-07 ENCOUNTER — Ambulatory Visit (HOSPITAL_COMMUNITY): Admission: EM | Admit: 2023-12-07 | Discharge: 2023-12-07 | Disposition: A | Discharge status: Home / Self Care

## 2023-12-07 ENCOUNTER — Encounter (HOSPITAL_COMMUNITY): Payer: Self-pay

## 2023-12-07 DIAGNOSIS — I10 Essential (primary) hypertension: Secondary | ICD-10-CM | POA: Diagnosis not present

## 2023-12-07 DIAGNOSIS — R202 Paresthesia of skin: Secondary | ICD-10-CM

## 2023-12-07 NOTE — ED Triage Notes (Signed)
 Patient states she feels off. Patient states she has fatigue that started today. No other symptoms.

## 2023-12-07 NOTE — ED Provider Notes (Signed)
 [facility]    CSN: [id number] Arrival date & time: 12/07/23  1428      History   Chief Complaint Chief Complaint  Patient presents with   Fatigue   Blood Pressure Check    HPI Linda Weber is a 38 y.o. female.   38 year old female presents today due to elevated blood pressure, tingling and just overall feeling fatigue.  This started about 130 today while she was at work prior to leaving.  She has states that she felt tingling in bilateral hands and in her face.  Just felt heavy.  Reports that this has improved and she is no longer feeling this way.  At this time she did not have any facial droop, extremity weakness, blurry vision, headache.  Denies any recent illness, cough, congestion, nasal congestion, runny nose, fever.  She does have low-grade fever here today.  Denies any chest pain or shortness of breath     Past Medical History:  Diagnosis Date   Abnormal Pap smear    Acute non-recurrent maxillary sinusitis 04/13/2021   Alcohol use 02/13/2021   Allergic rhinitis 04/13/2021   Encounter for annual physical exam 02/13/2021   History of PCOS 02/13/2021   Hypertension    Nicotine addiction 07/24/2013   Obesity    Uses marijuana 07/24/2013    Patient Active Problem List   Diagnosis Date Noted   Throat fullness 11/09/2023   Hypokalemia 11/09/2023   Pre-diabetes 10/20/2022   Vitamin D  deficiency 10/20/2022   Tinea versicolor 10/14/2022   Flexural eczema 04/13/2021   Hypertension 02/13/2021   Gastroesophageal reflux disease 02/13/2021   Mood and affect disturbance 07/24/2013   Obesity, unspecified 07/24/2013   Oligomenorrhea 12/22/2011    History reviewed. No pertinent surgical history.  OB History     Gravida  0   Para      Term      Preterm      AB      Living  0      SAB      IAB      Ectopic      Multiple      Live Births               Home Medications    Prior to Admission medications   Medication Sig Start  Date End Date Taking? Authorizing Provider  diltiazem  (CARDIZEM  CD) 180 MG 24 hr capsule Take 1 capsule (180 mg total) by mouth daily. 11/01/23  Yes Early, Sara E, NP  pantoprazole  (PROTONIX ) 40 MG tablet Take 1 tablet (40 mg total) by mouth daily. 11/04/23  Yes Early, Adriane Albe, NP  Vitamin D , Ergocalciferol , (DRISDOL ) 1.25 MG (50000 UNIT) CAPS capsule Take 1 capsule (50,000 Units total) by mouth every 7 (seven) days. 04/21/23  Yes Early, Sara E, NP  triamcinolone  cream (KENALOG ) 0.1 % Apply 1 Application topically 2 (two) times daily. To affected area(s) as needed 10/14/22   Early, Sara E, NP  loratadine  (CLARITIN ) 10 MG tablet Take 1 tablet (10 mg total) by mouth daily. Patient not taking: Reported on 09/16/2015 04/23/15 05/29/19  Amil Balding, PA-C    Family History Family History  Problem Relation Age of Onset   Breast cancer Maternal Grandmother    Diabetes Mother    Hypertension Mother     Social History Social History   Tobacco Use   Smoking status: Every Day    Current packs/day: 0.50    Average packs/day: 0.5 packs/day for 20.4 years (10.2  ttl pk-yrs)    Types: Cigarettes    Start date: 07/25/2003   Smokeless tobacco: Never  Vaping Use   Vaping status: Never Used  Substance Use Topics   Alcohol use: Yes    Comment: occasionally   Drug use: No    Types: Marijuana, Cocaine    Comment: Hx cocaine & marijuana use     Allergies   Benadryl [diphenhydramine hcl]   Review of Systems Review of Systems  See HPI Physical Exam Triage Vital Signs ED Triage Vitals  Encounter Vitals Group     BP 12/07/23 1505 (!) 161/100     Systolic BP Percentile --      Diastolic BP Percentile --      Pulse Rate 12/07/23 1505 96     Resp 12/07/23 1505 16     Temp 12/07/23 1506 99.3 F (37.4 C)     Temp Source 12/07/23 1506 Oral     SpO2 12/07/23 1505 100 %     Weight --      Height --      Head Circumference --      Peak Flow --      Pain Score 12/07/23 1505 0     Pain Loc --       Pain Education --      Exclude from Growth Chart --    No data found.  Updated Vital Signs BP (!) 161/100 (BP Location: Left Arm)   Pulse 96   Temp 99.3 F (37.4 C) (Oral)   Resp 16   LMP 12/07/2023 (Approximate)   SpO2 100%   Visual Acuity Right Eye Distance:   Left Eye Distance:   Bilateral Distance:    Right Eye Near:   Left Eye Near:    Bilateral Near:     Physical Exam Constitutional:      General: She is not in acute distress.    Appearance: Normal appearance. She is not ill-appearing, toxic-appearing or diaphoretic.  Eyes:     Conjunctiva/sclera: Conjunctivae normal.  Cardiovascular:     Rate and Rhythm: Normal rate and regular rhythm.     Heart sounds: Normal heart sounds.  Pulmonary:     Effort: Pulmonary effort is normal.     Breath sounds: Normal breath sounds.  Musculoskeletal:        General: Normal range of motion.  Skin:    General: Skin is warm and dry.  Neurological:     General: No focal deficit present.     Mental Status: She is alert.     Cranial Nerves: No cranial nerve deficit.     Sensory: No sensory deficit.     Motor: No weakness.     Coordination: Coordination normal.     Gait: Gait normal.  Psychiatric:        Mood and Affect: Mood normal.      UC Treatments / Results  Labs (all labs ordered are listed, but only abnormal results are displayed) Labs Reviewed - No data to display  EKG   Radiology No results found.  Procedures Procedures (including critical care time)  Medications Ordered in UC Medications - No data to display  Initial Impression / Assessment and Plan / UC Course  I have reviewed the triage vital signs and the nursing notes.  Pertinent labs & imaging results that were available during my care of the patient were reviewed by me and considered in my medical decision making (see chart for details).     Hypertension  with tingling-nothing concerning on exam.  No focal neurodeficits noted. Patient reports  that symptoms are resolving Blood pressure is elevated here checked twice around 160/100 systolic. She does not have any chest pain or shortness of breath. She does have low-grade fever not sure if she is coming down with some sort of illness. Recommend to keep an eye on her blood pressures at home and if her symptoms worsen or her blood pressures get more elevated she will need to go to the ER. Otherwise call her doctor for follow-up appointment Final Clinical Impressions(s) / UC Diagnoses   Final diagnoses:  Tingling  Hypertension, unspecified type     Discharge Instructions      I am not seeing anything concerning on your exam If the problem returns and you start having any weakness in the arms, legs, facial droop, blurry vision or headache you will need to go to the ER. Keep an eye on your blood pressures at home Call your doctor tomorrow for follow-up    ED Prescriptions   None    PDMP not reviewed this encounter.   Landa Pine, FNP 12/07/23 1545

## 2023-12-07 NOTE — Discharge Instructions (Signed)
 I am not seeing anything concerning on your exam If the problem returns and you start having any weakness in the arms, legs, facial droop, blurry vision or headache you will need to go to the ER. Keep an eye on your blood pressures at home Call your doctor tomorrow for follow-up

## 2023-12-09 ENCOUNTER — Encounter: Payer: Self-pay | Admitting: Nurse Practitioner

## 2023-12-09 ENCOUNTER — Ambulatory Visit: Admitting: Nurse Practitioner

## 2023-12-09 VITALS — BP 128/84 | HR 91 | Wt 269.2 lb

## 2023-12-09 DIAGNOSIS — I10 Essential (primary) hypertension: Secondary | ICD-10-CM | POA: Diagnosis not present

## 2023-12-09 DIAGNOSIS — J301 Allergic rhinitis due to pollen: Secondary | ICD-10-CM

## 2023-12-09 MED ORDER — LEVOCETIRIZINE DIHYDROCHLORIDE 5 MG PO TABS: 5.0000 mg | ORAL_TABLET | Freq: Every evening | ORAL | 1 refills | Status: DC

## 2023-12-09 NOTE — Progress Notes (Signed)
 Annella Kief, DNP, AGNP-c [facility] [address] [place], [place] [address] [phone]   ACUTE VISIT- ESTABLISHED PATIENT  Blood pressure 128/84, pulse 91, weight 269 lb 3.2 oz (122.1 kg), last menstrual period 12/07/2023.  Subjective:  HPI Linda Weber is a 38 y.o. female presents to day for evaluation of acute concern(s).   History of Present Illness Linda Weber is a 38 year old female with hypertension who presents with concerns of elevated blood pressure and associated symptoms.  She was recently seen in the UC for feeling ill and was noted to have a BP reading of 161/100 mmHg. Symptoms at the time included cramping, tingling in her hands, tiredness, and a general sense of malaise. She also reports a low-grade fever and diarrhea. These symptoms have since resolved.   She has a history of hypertension and has been on diltiazem  for approximately three years. Her home blood pressure readings are elevated, but she questions the accuracy of her electronic blood pressure monitor. She notes that her blood pressure cuff is a "regular" size and she is in need of a large cuff. She also reports the electronic readers tend to increase her stress due to pain. She also attributes her elevated BP in other offices due to anxiety and discomfort from the electronic cuffs.  She admits to smoking and socially drinking alcohol but takes her blood pressure medication daily. She has noticed that her blood pressure is higher after smoking or when she is anxious.  No chest pain, shortness of breath, or dizziness. Occasionally experiences sinus-related headaches reported as frontal. No swelling in feet or ankles.  ROS negative except for what is listed in HPI. History, Medications, Surgery, SDOH, and Family History reviewed and updated as appropriate.  Objective:  Physical Exam Vitals and nursing note reviewed.  Constitutional:      Appearance: Normal appearance. She is obese.   HENT:     Head: Normocephalic.  Eyes:     Conjunctiva/sclera: Conjunctivae normal.     Pupils: Pupils are equal, round, and reactive to light.  Cardiovascular:     Rate and Rhythm: Normal rate and regular rhythm.     Pulses: Normal pulses.     Heart sounds: Normal heart sounds.  Pulmonary:     Effort: Pulmonary effort is normal.     Breath sounds: Normal breath sounds.  Musculoskeletal:        General: Normal range of motion.     Cervical back: Normal range of motion.     Right lower leg: No edema.     Left lower leg: No edema.  Skin:    General: Skin is warm.  Neurological:     General: No focal deficit present.     Mental Status: She is alert and oriented to person, place, and time.  Psychiatric:        Mood and Affect: Mood normal.         Assessment & Plan:   Problem List Items Addressed This Visit     Hypertension - Primary   Blood pressure was elevated at urgent care (161/100 mmHg) but is well-controlled in the office (128/84 mmHg). Symptoms of cramping, tingling, tiredness, and anxiety may be related to a viral illness, particularly since she was febrile at the time. Current medication is diltiazem , which is well-tolerated and upon reviewing past blood pressures, has been adequate for control. Discussed potential change to amlodipine but decided against it due to well-controlled office readings. Discussed the impact of  anxiety, pain, and smoking on blood pressure. Recommended home monitoring with a reliable cuff, as electronic and wrist monitors may be inaccurate. Emphasized proper technique for accurate readings. - Monitor blood pressure at home using a reliable cuff - Record blood pressure readings over the next week and send results via MyChart- let me know what type of cuff was used - Advise on proper technique for home blood pressure monitoring - Discuss potential symptoms of hypertension such as headaches, dizziness, and vision changes and when to seek emergency  care.       Seasonal allergic rhinitis due to pollen   Current treatment with loratadine  is ineffective. Due to allergy to diphenhydramine, recommended levocetirizine, which is stronger than cetirizine and not in the same class as diphenhydramine. Advised to take levocetirizine at bedtime due to sedative effects. - Prescribe levocetirizine to be taken at bedtime         Annella Kief, DNP, AGNP-c

## 2023-12-09 NOTE — Patient Instructions (Addendum)
 I want you to check your blood pressures at home for the next week and then send me a message with the read outs so I can see what this is looking like at home. We will make adjustments if we need to.   Hypertension, Adult Hypertension is another name for high blood pressure. High blood pressure forces your heart to work harder to pump blood. This can cause problems over time. There are two numbers in a blood pressure reading. There is a top number (systolic) over a bottom number (diastolic). It is best to have a blood pressure that is below 120/80. What are the causes? The cause of this condition is not known. Some other conditions can lead to high blood pressure. What increases the risk? Some lifestyle factors can make you more likely to develop high blood pressure: Smoking. Not getting enough exercise or physical activity. Being overweight. Having too much fat, sugar, calories, or salt (sodium) in your diet. Drinking too much alcohol. Other risk factors include: Having any of these conditions: Heart disease. Diabetes. High cholesterol. Kidney disease. Obstructive sleep apnea. Having a family history of high blood pressure and high cholesterol. Age. The risk increases with age. Stress. What are the signs or symptoms? High blood pressure may not cause symptoms. Very high blood pressure (hypertensive crisis) may cause: Headache. Fast or uneven heartbeats (palpitations). Shortness of breath. Nosebleed. Vomiting or feeling like you may vomit (nauseous). Changes in how you see. Very bad chest pain. Feeling dizzy. Seizures. How is this treated? This condition is treated by making healthy lifestyle changes, such as: Eating healthy foods. Exercising more. Drinking less alcohol. Your doctor may prescribe medicine if lifestyle changes do not help enough and if: Your top number is above 130. Your bottom number is above 80. Your personal target blood pressure may vary. Follow these  instructions at home: Eating and drinking  If told, follow the DASH eating plan. To follow this plan: Fill one half of your plate at each meal with fruits and vegetables. Fill one fourth of your plate at each meal with whole grains. Whole grains include whole-wheat pasta, brown rice, and whole-grain bread. Eat or drink low-fat dairy products, such as skim milk or low-fat yogurt. Fill one fourth of your plate at each meal with low-fat (lean) proteins. Low-fat proteins include fish, chicken without skin, eggs, beans, and tofu. Avoid fatty meat, cured and processed meat, or chicken with skin. Avoid pre-made or processed food. Limit the amount of salt in your diet to less than 1,500 mg each day. Do not drink alcohol if: Your doctor tells you not to drink. You are pregnant, may be pregnant, or are planning to become pregnant. If you drink alcohol: Limit how much you have to: 0-1 drink a day for women. 0-2 drinks a day for men. Know how much alcohol is in your drink. In the [place] one drink equals one 12 oz bottle of beer (355 mL), one 5 oz glass of wine (148 mL), or one 1 oz glass of hard liquor (44 mL). Lifestyle  Work with your doctor to stay at a healthy weight or to lose weight. Ask your doctor what the best weight is for you. Get at least 30 minutes of exercise that causes your heart to beat faster (aerobic exercise) most days of the week. This may include walking, swimming, or biking. Get at least 30 minutes of exercise that strengthens your muscles (resistance exercise) at least 3 days a week. This may include lifting  weights or doing Pilates. Do not smoke or use any products that contain nicotine or tobacco. If you need help quitting, ask your doctor. Check your blood pressure at home as told by your doctor. Keep all follow-up visits. Medicines Take over-the-counter and prescription medicines only as told by your doctor. Follow directions carefully. Do not skip doses of blood pressure  medicine. The medicine does not work as well if you skip doses. Skipping doses also puts you at risk for problems. Ask your doctor about side effects or reactions to medicines that you should watch for. Contact a doctor if: You think you are having a reaction to the medicine you are taking. You have headaches that keep coming back. You feel dizzy. You have swelling in your ankles. You have trouble with your vision. Get help right away if: You get a very bad headache. You start to feel mixed up (confused). You feel weak or numb. You feel faint. You have very bad pain in your: Chest. Belly (abdomen). You vomit more than once. You have trouble breathing. These symptoms may be an emergency. Get help right away. Call 911. Do not wait to see if the symptoms will go away. Do not drive yourself to the hospital. Summary Hypertension is another name for high blood pressure. High blood pressure forces your heart to work harder to pump blood. For most people, a normal blood pressure is less than 120/80. Making healthy choices can help lower blood pressure. If your blood pressure does not get lower with healthy choices, you may need to take medicine. This information is not intended to replace advice given to you by your health care provider. Make sure you discuss any questions you have with your health care provider. Document Revised: 05/21/2021 Document Reviewed: 05/21/2021 Elsevier Patient Education  2024 [employer].

## 2023-12-09 NOTE — Assessment & Plan Note (Signed)
 Current treatment with loratadine  is ineffective. Due to allergy to diphenhydramine, recommended levocetirizine, which is stronger than cetirizine and not in the same class as diphenhydramine. Advised to take levocetirizine at bedtime due to sedative effects. - Prescribe levocetirizine to be taken at bedtime

## 2023-12-09 NOTE — Assessment & Plan Note (Signed)
 Blood pressure was elevated at urgent care (161/100 mmHg) but is well-controlled in the office (128/84 mmHg). Symptoms of cramping, tingling, tiredness, and anxiety may be related to a viral illness, particularly since she was febrile at the time. Current medication is diltiazem , which is well-tolerated and upon reviewing past blood pressures, has been adequate for control. Discussed potential change to amlodipine but decided against it due to well-controlled office readings. Discussed the impact of anxiety, pain, and smoking on blood pressure. Recommended home monitoring with a reliable cuff, as electronic and wrist monitors may be inaccurate. Emphasized proper technique for accurate readings. - Monitor blood pressure at home using a reliable cuff - Record blood pressure readings over the next week and send results via MyChart- let me know what type of cuff was used - Advise on proper technique for home blood pressure monitoring - Discuss potential symptoms of hypertension such as headaches, dizziness, and vision changes and when to seek emergency care.

## 2024-02-08 ENCOUNTER — Other Ambulatory Visit

## 2024-02-08 DIAGNOSIS — R7989 Other specified abnormal findings of blood chemistry: Secondary | ICD-10-CM

## 2024-02-09 ENCOUNTER — Ambulatory Visit: Payer: Self-pay | Admitting: Nurse Practitioner

## 2024-02-09 LAB — COMPREHENSIVE METABOLIC PANEL WITH GFR
ALT: 19 IU/L (ref 0–32)
AST: 25 IU/L (ref 0–40)
Albumin: 3.9 g/dL (ref 3.9–4.9)
Alkaline Phosphatase: 79 IU/L (ref 44–121)
BUN/Creatinine Ratio: 10 (ref 9–23)
BUN: 9 mg/dL (ref 6–20)
Bilirubin Total: 0.4 mg/dL (ref 0.0–1.2)
CO2: 22 mmol/L (ref 20–29)
Calcium: 8.5 mg/dL — ABNORMAL LOW (ref 8.7–10.2)
Chloride: 101 mmol/L (ref 96–106)
Creatinine, Ser: 0.94 mg/dL (ref 0.57–1.00)
Globulin, Total: 2.6 g/dL (ref 1.5–4.5)
Glucose: 120 mg/dL — ABNORMAL HIGH (ref 70–99)
Potassium: 3.4 mmol/L — ABNORMAL LOW (ref 3.5–5.2)
Sodium: 141 mmol/L (ref 134–144)
Total Protein: 6.5 g/dL (ref 6.0–8.5)
eGFR: 80 mL/min/{1.73_m2} (ref >59)

## 2024-06-11 ENCOUNTER — Ambulatory Visit: Admitting: Nurse Practitioner

## 2024-06-11 ENCOUNTER — Encounter: Payer: Self-pay | Admitting: Nurse Practitioner

## 2024-06-11 VITALS — BP 124/80 | HR 88 | Wt 274.0 lb

## 2024-06-11 DIAGNOSIS — Z6841 Body Mass Index (BMI) 40.0 and over, adult: Secondary | ICD-10-CM

## 2024-06-11 DIAGNOSIS — E66813 Obesity, class 3: Secondary | ICD-10-CM | POA: Diagnosis not present

## 2024-06-11 DIAGNOSIS — E559 Vitamin D deficiency, unspecified: Secondary | ICD-10-CM | POA: Diagnosis not present

## 2024-06-11 DIAGNOSIS — L2082 Flexural eczema: Secondary | ICD-10-CM

## 2024-06-11 DIAGNOSIS — K219 Gastro-esophageal reflux disease without esophagitis: Secondary | ICD-10-CM

## 2024-06-11 DIAGNOSIS — J301 Allergic rhinitis due to pollen: Secondary | ICD-10-CM

## 2024-06-11 DIAGNOSIS — R110 Nausea: Secondary | ICD-10-CM | POA: Insufficient documentation

## 2024-06-11 DIAGNOSIS — I10 Essential (primary) hypertension: Secondary | ICD-10-CM

## 2024-06-11 DIAGNOSIS — R7303 Prediabetes: Secondary | ICD-10-CM | POA: Diagnosis not present

## 2024-06-11 DIAGNOSIS — K2900 Acute gastritis without bleeding: Secondary | ICD-10-CM

## 2024-06-11 DIAGNOSIS — K2970 Gastritis, unspecified, without bleeding: Secondary | ICD-10-CM | POA: Insufficient documentation

## 2024-06-11 MED ORDER — DILTIAZEM HCL ER COATED BEADS 180 MG PO CP24: 180.0000 mg | ORAL_CAPSULE | Freq: Every day | ORAL | 3 refills | Status: AC

## 2024-06-11 MED ORDER — VITAMIN D (ERGOCALCIFEROL) 1.25 MG (50000 UNIT) PO CAPS: 50000.0000 [IU] | ORAL_CAPSULE | ORAL | 3 refills | Status: AC

## 2024-06-11 MED ORDER — PANTOPRAZOLE SODIUM 40 MG PO TBEC: 40.0000 mg | DELAYED_RELEASE_TABLET | Freq: Every day | ORAL | 3 refills | Status: AC

## 2024-06-11 MED ORDER — ONDANSETRON 4 MG PO TBDP: 4.0000 mg | ORAL_TABLET | Freq: Three times a day (TID) | ORAL | 0 refills | Status: AC | PRN

## 2024-06-11 MED ORDER — TRIAMCINOLONE ACETONIDE 0.5 % EX OINT: 1.0000 | TOPICAL_OINTMENT | Freq: Two times a day (BID) | CUTANEOUS | 3 refills | Status: AC

## 2024-06-11 MED ORDER — LEVOCETIRIZINE DIHYDROCHLORIDE 5 MG PO TABS: 5.0000 mg | ORAL_TABLET | Freq: Every evening | ORAL | 3 refills | Status: AC

## 2024-06-11 NOTE — Assessment & Plan Note (Signed)
 Not currently taking vitamin D  supplements. - Checked vitamin D  levels

## 2024-06-11 NOTE — Assessment & Plan Note (Signed)
 Acute onset of gastrointestinal symptoms including nausea, queasiness, watery stools, and a burning sensation in the stomach. Possible viral gastroenteritis or reflux-related symptoms. Pepcid dual action provided in office today for symptom management. - Consider famotidine for burning sensation - Pantoprazole  refills sent.

## 2024-06-11 NOTE — Assessment & Plan Note (Signed)
Recommend diet and exercise management. Labs pending.

## 2024-06-11 NOTE — Assessment & Plan Note (Signed)
 Intermittent allergic symptoms including itching and rhinorrhea. Previous use of levocetirizine effective in management. - Prescribed levocetirizine for allergy symptoms

## 2024-06-11 NOTE — Progress Notes (Signed)
 Catheline Doing, DNP, AGNP-c [facility]  [address] [place], [place] [address] [phone]  ESTABLISHED PATIENT- Chronic Health and/or Follow-Up Visit on 06/11/2024  Blood pressure 124/80, pulse 88, weight 274 lb (124.3 kg).   HPI:  History of Present Illness Linda Weber is a 38 year old female who presents for routine follow-up. Today she also has gastrointestinal discomfort and nausea.  She began experiencing gastrointestinal discomfort around noon on the day of the visit, described as a queasy feeling with an urge to use the bathroom, resulting in watery stools. She also experiences a burning sensation in her stomach. She has not eaten lunch but had breakfast earlier in the day. She is not currently taking pantoprazole  for reflux as she ran out of refills.  She experiences swelling in her feet, which worsened after wearing dress shoes for a work event. She generally avoids wearing such shoes due to discomfort.  She has a history of eczema, which flares up periodically. She uses triamcinolone  cream for treatment. She inquires about obtaining an ointment, specifically triamcinolone , for her eczema.  She has a history of allergies and experiences itching, which she attributes to an allergic reaction, possibly to detergents like Tide. She is not currently using levocetirizine for allergy symptoms.  No increased hunger, thirst, urination, headaches, or shortness of breath. All ROS negative with exception of what is listed above.    PHYSICAL EXAM Physical Exam Vitals and nursing note reviewed.  Constitutional:      General: She is not in acute distress.    Appearance: Normal appearance. She is obese. She is not ill-appearing.  HENT:     Head: Normocephalic.  Eyes:     Pupils: Pupils are equal, round, and reactive to light.  Cardiovascular:     Rate and Rhythm: Normal rate and regular rhythm.     Pulses: Normal pulses.     Heart sounds: Normal heart  sounds.  Pulmonary:     Effort: Pulmonary effort is normal.     Breath sounds: Normal breath sounds.  Abdominal:     General: There is no distension.     Palpations: Abdomen is soft.     Tenderness: There is no abdominal tenderness.  Musculoskeletal:        General: Normal range of motion.     Right lower leg: Edema present.     Left lower leg: Edema present.  Skin:    General: Skin is warm and dry.     Capillary Refill: Capillary refill takes less than 2 seconds.  Neurological:     Mental Status: She is alert and oriented to person, place, and time.     Sensory: No sensory deficit.     Motor: No weakness.  Psychiatric:        Mood and Affect: Mood normal.     PLAN Problem List Items Addressed This Visit     Obesity, unspecified   Recommend diet and exercise management. Labs pending.       Relevant Orders   CBC with Differential/Platelet   Comprehensive metabolic panel with GFR   Hypertension   Blood pressure is well-controlled with cardizem . - Continue current antihypertensive medication regimen      Relevant Medications   diltiazem  (CARDIZEM  CD) 180 MG 24 hr capsule   Other Relevant Orders   CBC with Differential/Platelet   Comprehensive metabolic panel with GFR   Gastroesophageal reflux disease   Reflux symptoms with a burning sensation in the stomach. Not currently taking pantoprazole  due  to lack of refills. - Refilled pantoprazole  prescription       Relevant Medications   pantoprazole  (PROTONIX ) 40 MG tablet   ondansetron (ZOFRAN-ODT) 4 MG disintegrating tablet   Flexural eczema   Flares up periodically. Currently using triamcinolone  cream, but would like to try triamcinolone  ointment. - Prescribed triamcinolone  ointment for eczema flares      Relevant Medications   triamcinolone  ointment (KENALOG ) 0.5 %   Pre-diabetes - Primary   Blood sugar averages have improved over the years. Diet and exercise managed at this time. Recommend continue with low  fat, low card diet options and daily activity of at least 20 minutes sustained for best control.  - Checked A1c levels      Relevant Orders   Hemoglobin A1c   CBC with Differential/Platelet   Comprehensive metabolic panel with GFR   Vitamin D  deficiency   Not currently taking vitamin D  supplements. - Checked vitamin D  levels      Relevant Medications   Vitamin D , Ergocalciferol , (DRISDOL ) 1.25 MG (50000 UNIT) CAPS capsule   Other Relevant Orders   VITAMIN D  25 Hydroxy (Vit-D Deficiency, Fractures)   Seasonal allergic rhinitis due to pollen   Intermittent allergic symptoms including itching and rhinorrhea. Previous use of levocetirizine effective in management. - Prescribed levocetirizine for allergy symptoms      Nausea   Relevant Medications   ondansetron (ZOFRAN-ODT) 4 MG disintegrating tablet   Gastritis   Acute onset of gastrointestinal symptoms including nausea, queasiness, watery stools, and a burning sensation in the stomach. Possible viral gastroenteritis or reflux-related symptoms. Pepcid dual action provided in office today for symptom management. - Consider famotidine for burning sensation - Pantoprazole  refills sent.           Return in about 6 months (around 12/10/2024) for CPE.  Catheline Doing, DNP, AGNP-c

## 2024-06-11 NOTE — Assessment & Plan Note (Signed)
 Flares up periodically. Currently using triamcinolone  cream, but would like to try triamcinolone  ointment. - Prescribed triamcinolone  ointment for eczema flares

## 2024-06-11 NOTE — Assessment & Plan Note (Signed)
 Reflux symptoms with a burning sensation in the stomach. Not currently taking pantoprazole  due to lack of refills. - Refilled pantoprazole  prescription

## 2024-06-11 NOTE — Assessment & Plan Note (Signed)
 Blood pressure is well-controlled with cardizem . - Continue current antihypertensive medication regimen

## 2024-06-11 NOTE — Assessment & Plan Note (Signed)
 Blood sugar averages have improved over the years. Diet and exercise managed at this time. Recommend continue with low fat, low card diet options and daily activity of at least 20 minutes sustained for best control.  - Checked A1c levels

## 2024-06-12 LAB — COMPREHENSIVE METABOLIC PANEL WITH GFR
ALT: 14 IU/L (ref 0–32)
AST: 20 IU/L (ref 0–40)
Albumin: 4.1 g/dL (ref 3.9–4.9)
Alkaline Phosphatase: 76 IU/L (ref 41–116)
BUN/Creatinine Ratio: 16 (ref 9–23)
BUN: 13 mg/dL (ref 6–20)
Bilirubin Total: 0.3 mg/dL (ref 0.0–1.2)
CO2: 25 mmol/L (ref 20–29)
Calcium: 9.3 mg/dL (ref 8.7–10.2)
Chloride: 98 mmol/L (ref 96–106)
Creatinine, Ser: 0.81 mg/dL (ref 0.57–1.00)
Globulin, Total: 2.8 g/dL (ref 1.5–4.5)
Glucose: 81 mg/dL (ref 70–99)
Potassium: 3.7 mmol/L (ref 3.5–5.2)
Sodium: 139 mmol/L (ref 134–144)
Total Protein: 6.9 g/dL (ref 6.0–8.5)
eGFR: 96 mL/min/1.73 (ref >59)

## 2024-06-12 LAB — CBC WITH DIFFERENTIAL/PLATELET
Basophils Absolute: 0.1 x10E3/uL (ref 0.0–0.2)
Basos: 1 %
EOS (ABSOLUTE): 0.1 x10E3/uL (ref 0.0–0.4)
Eos: 1 %
Hematocrit: 37.4 % (ref 34.0–46.6)
Hemoglobin: 11.8 g/dL (ref 11.1–15.9)
Immature Grans (Abs): 0 x10E3/uL (ref 0.0–0.1)
Immature Granulocytes: 0 %
Lymphocytes Absolute: 1.7 x10E3/uL (ref 0.7–3.1)
Lymphs: 33 %
MCH: 25.1 pg — ABNORMAL LOW (ref 26.6–33.0)
MCHC: 31.6 g/dL (ref 31.5–35.7)
MCV: 79 fL (ref 79–97)
Monocytes Absolute: 0.5 x10E3/uL (ref 0.1–0.9)
Monocytes: 10 %
Neutrophils Absolute: 2.8 x10E3/uL (ref 1.4–7.0)
Neutrophils: 55 %
Platelets: 306 x10E3/uL (ref 150–450)
RBC: 4.71 x10E6/uL (ref 3.77–5.28)
RDW: 14.6 % (ref 11.7–15.4)
WBC: 5.2 x10E3/uL (ref 3.4–10.8)

## 2024-06-12 LAB — HEMOGLOBIN A1C
Est. average glucose Bld gHb Est-mCnc: 117 mg/dL
Hgb A1c MFr Bld: 5.7 % — ABNORMAL HIGH (ref 4.8–5.6)

## 2024-06-12 LAB — VITAMIN D 25 HYDROXY (VIT D DEFICIENCY, FRACTURES): Vit D, 25-Hydroxy: 36.8 ng/mL (ref 30.0–100.0)

## 2024-06-19 ENCOUNTER — Ambulatory Visit: Payer: Self-pay | Admitting: Nurse Practitioner

## 2024-07-02 ENCOUNTER — Ambulatory Visit: Admitting: Physician Assistant

## 2024-07-02 ENCOUNTER — Encounter: Payer: Self-pay | Admitting: Physician Assistant

## 2024-07-02 VITALS — BP 154/84 | HR 88

## 2024-07-02 DIAGNOSIS — L209 Atopic dermatitis, unspecified: Secondary | ICD-10-CM

## 2024-07-02 DIAGNOSIS — D692 Other nonthrombocytopenic purpura: Secondary | ICD-10-CM | POA: Diagnosis not present

## 2024-07-02 DIAGNOSIS — B360 Pityriasis versicolor: Secondary | ICD-10-CM | POA: Diagnosis not present

## 2024-07-02 MED ORDER — TRIAMCINOLONE ACETONIDE 0.1 % EX OINT: 1.0000 | TOPICAL_OINTMENT | Freq: Two times a day (BID) | CUTANEOUS | 0 refills | Status: AC

## 2024-07-02 MED ORDER — KETOCONAZOLE 2 % EX SHAM: 1.0000 | MEDICATED_SHAMPOO | Freq: Once | CUTANEOUS | 1 refills | Status: AC

## 2024-07-02 NOTE — Progress Notes (Signed)
   New Patient Visit   Subjective  Linda Weber is a 38 y.o. female NEW PATIENT who presents for the following: flexural eczema. Has had eczema her whole life. Changes in weather definitely worsens her condition.   Other concern(s): tinea versicolor on her face. Was prescribed ketoconazole  cream and used for one week and stopped because she said it did not work. Other concern is red patches on her left upper arm.    The following portions of the chart were reviewed this encounter and updated as appropriate: medications, allergies, medical history  Review of Systems:  No other skin or systemic complaints except as noted in HPI or Assessment and Plan.  Objective  Well appearing patient in no apparent distress; mood and affect are within normal limits. A focused examination was performed of the following areas: Face and chest,neck  Relevant exam findings are noted in the Assessment and Plan.    Assessment & Plan   Purpura - Chronic; persistent and recurrent.  Treatable, but not curable. - Violaceous macules and patches - Benign - Related to trauma, age, sun damage and/or use of blood thinners, chronic use of topical and/or oral steroids - Observe  ATOPIC DERMATITIS - TRUNK AND EXTREMITIES  - hyperpigmented patches and plaques   Treatment Plan: TMC 0.1% ointment apply to affected areas twice daily until clear as needed for flares.   Tinea Versicolor  Use ketoconazole  shampoo to wash face daily until clear then monthly for prevention.   Tinea versicolor is a chronic recurrent skin rash causing discolored scaly spots most commonly seen on back, chest, and/or shoulders.  It is generally asymptomatic. The rash is due to overgrowth of a common type of yeast present on everyone's skin and it is not contagious.  It tends to flare more in the summer due to increased sweating on trunk.  After rash is treated, the scaliness will resolve, but the discoloration will take longer to return to  normal pigmentation. The periodic use of an OTC medicated soap/shampoo with zinc or selenium sulfide can be helpful to prevent yeast overgrowth and recurrence.     OTHER NONTHROMBOCYTOPENIC PURPURA   ATOPIC DERMATITIS, UNSPECIFIED TYPE   TINEA VERSICOLOR    Return if symptoms worsen or fail to improve.  I, Roseline Hutchinson, CMA, am acting as scribe for Brittley Regner K, PA-C .   Documentation: I have reviewed the above documentation for accuracy and completeness, and I agree with the above.  Lawanda Holzheimer K, PA-C

## 2024-07-02 NOTE — Patient Instructions (Signed)

## 2025-06-11 ENCOUNTER — Encounter: Payer: Self-pay | Admitting: Nurse Practitioner
